# Patient Record
Sex: Female | Born: 1940 | Race: White | Hispanic: No | Marital: Married | Smoking: Never smoker
Health system: Southern US, Community
[De-identification: ages and names within clinical notes are randomized; demographics above are authoritative.]

## PROBLEM LIST (undated history)

## (undated) DIAGNOSIS — G4733 Obstructive sleep apnea (adult) (pediatric): Secondary | ICD-10-CM

## (undated) DIAGNOSIS — E669 Obesity, unspecified: Secondary | ICD-10-CM

## (undated) DIAGNOSIS — I509 Heart failure, unspecified: Secondary | ICD-10-CM

## (undated) DIAGNOSIS — M549 Dorsalgia, unspecified: Secondary | ICD-10-CM

## (undated) DIAGNOSIS — I4891 Unspecified atrial fibrillation: Secondary | ICD-10-CM

## (undated) DIAGNOSIS — I10 Essential (primary) hypertension: Secondary | ICD-10-CM

## (undated) DIAGNOSIS — E785 Hyperlipidemia, unspecified: Secondary | ICD-10-CM

## (undated) DIAGNOSIS — F32A Depression, unspecified: Secondary | ICD-10-CM

## (undated) DIAGNOSIS — K219 Gastro-esophageal reflux disease without esophagitis: Secondary | ICD-10-CM

## (undated) DIAGNOSIS — M25561 Pain in right knee: Secondary | ICD-10-CM

## (undated) DIAGNOSIS — F329 Major depressive disorder, single episode, unspecified: Secondary | ICD-10-CM

## (undated) DIAGNOSIS — M25562 Pain in left knee: Secondary | ICD-10-CM

## (undated) HISTORY — PX: CHOLECYSTECTOMY: SHX55

## (undated) HISTORY — PX: ABDOMINAL HYSTERECTOMY: SHX81

---

## 2004-03-03 ENCOUNTER — Ambulatory Visit (HOSPITAL_COMMUNITY): Admission: RE | Admit: 2004-03-03 | Discharge: 2004-03-03 | Payer: Self-pay | Admitting: Surgery

## 2004-03-09 ENCOUNTER — Ambulatory Visit (HOSPITAL_BASED_OUTPATIENT_CLINIC_OR_DEPARTMENT_OTHER): Admission: RE | Admit: 2004-03-09 | Discharge: 2004-03-09 | Payer: Self-pay | Admitting: Surgery

## 2004-03-10 ENCOUNTER — Encounter: Admission: RE | Admit: 2004-03-10 | Discharge: 2004-03-10 | Payer: Self-pay | Admitting: Surgery

## 2004-03-10 ENCOUNTER — Ambulatory Visit (HOSPITAL_COMMUNITY): Admission: RE | Admit: 2004-03-10 | Discharge: 2004-03-10 | Payer: Self-pay | Admitting: Surgery

## 2004-10-18 ENCOUNTER — Ambulatory Visit: Payer: Self-pay | Admitting: Internal Medicine

## 2006-10-02 ENCOUNTER — Encounter: Admission: RE | Admit: 2006-10-02 | Discharge: 2006-10-02 | Payer: Self-pay | Admitting: Family Medicine

## 2007-07-29 ENCOUNTER — Encounter: Admission: RE | Admit: 2007-07-29 | Discharge: 2007-07-29 | Payer: Self-pay | Admitting: Family Medicine

## 2010-06-06 ENCOUNTER — Encounter: Payer: Self-pay | Admitting: Orthopedic Surgery

## 2010-09-30 NOTE — Procedures (Signed)
Cindy Gibbs, Cindy Gibbs               ACCOUNT NO.:  [id number]   MEDICAL RECORD NO.:  [id number]          PATIENT TYPE:  OUT   LOCATION:  SLEEP CENTER                 FACILITY:  [facility]   PHYSICIAN:  Clinton D. Maple Hudson, M.D. DATE OF BIRTH:  1940/08/29   DATE OF STUDY:                              NOCTURNAL POLYSOMNOGRAM   REFERRING PHYSICIAN:  Thornton Park. Daphine Deutscher, M.D.   INDICATION FOR STUDY:  Insomnia with sleep apnea.   Epworth Sleepiness score 3/24.  Neck size 17 inches.  BMI 50.9.  Weight 299  pounds.   SLEEP ARCHITECTURE:  Total sleep time 329 minutes with sleep efficiency 74%.  Stage I was 5%, stage II 67%, stages III and IV 2%.  REM was 2.5 ________ to  25.  REM was 25% of total sleep time.  Awake after sleep onset 47 minutes.  Arousal index is 36 which is increased.  Most arousals seemed respiratory  related.   RESPIRATORY DATA:  Split study protocol.  RDI 24.7 per hour indicated  moderate obstructive sleep apnea/hypopnea syndrome before CPAP titration.  This included 14 obstructive apnea and 42 hypopnea's before CPAP.  Events  were not positional.  REM RDI was 3 per hour.  CPAP was titrated to 16 CWP,  RDI 0 per hour.  A medium Respironics comfort full face mask was used.   OXYGEN DATA:  Moderate to loud snoring with oxygen desaturation to a nadir  to 84% during apneas before CPAP.  After CPAP titration, oxygen saturation  held 93% to 96% on room air.   CARDIAC DATA:  Sinus rhythm with PAC's and intervals of what appeared to be  wandering atrial pacemaker and possibly a few junctional beats.   MOVEMENTS/PARASOMNIA:  A total of 396 limb jerks were recorded of which 18  were associated with arousal or awakening for a periodic limb movement with  arousal index of 3.4 per hour which has increased.   IMPRESSION/RECOMMENDATION:  Moderate obstructive sleep apnea/hypopnea  syndrome, RDI 24.7 per hour with desaturation to 84%.  CPAP titration to 16  CWP using a medium Respironics  comfort full face mask with heated  humidifier.  Frequent PAC's and question of occasional ectopic atrial  pacemaker and junctional beats.   Periodic limb movement with arousal, 3.4 per hour.                                                           Clinton D. Maple Hudson, M.D.  Diplomate, American Board  CDY/MEDQ  D:  03/13/2004 09:38:15  T:  03/13/2004 14:50:38  Job:  [id number]

## 2014-05-26 DIAGNOSIS — M5136 Other intervertebral disc degeneration, lumbar region: Secondary | ICD-10-CM | POA: Diagnosis not present

## 2014-05-27 DIAGNOSIS — G4733 Obstructive sleep apnea (adult) (pediatric): Secondary | ICD-10-CM | POA: Diagnosis not present

## 2014-06-11 DIAGNOSIS — G4733 Obstructive sleep apnea (adult) (pediatric): Secondary | ICD-10-CM | POA: Diagnosis not present

## 2014-06-25 DIAGNOSIS — M961 Postlaminectomy syndrome, not elsewhere classified: Secondary | ICD-10-CM | POA: Diagnosis not present

## 2014-07-01 DIAGNOSIS — Z23 Encounter for immunization: Secondary | ICD-10-CM | POA: Diagnosis not present

## 2014-07-01 DIAGNOSIS — M810 Age-related osteoporosis without current pathological fracture: Secondary | ICD-10-CM | POA: Diagnosis not present

## 2014-07-01 DIAGNOSIS — I272 Other secondary pulmonary hypertension: Secondary | ICD-10-CM | POA: Diagnosis not present

## 2014-07-01 DIAGNOSIS — E780 Pure hypercholesterolemia: Secondary | ICD-10-CM | POA: Diagnosis not present

## 2014-07-01 DIAGNOSIS — I10 Essential (primary) hypertension: Secondary | ICD-10-CM | POA: Diagnosis not present

## 2014-07-01 DIAGNOSIS — R079 Chest pain, unspecified: Secondary | ICD-10-CM | POA: Diagnosis not present

## 2014-07-12 DIAGNOSIS — G4733 Obstructive sleep apnea (adult) (pediatric): Secondary | ICD-10-CM | POA: Diagnosis not present

## 2015-03-17 ENCOUNTER — Inpatient Hospital Stay (HOSPITAL_COMMUNITY): Payer: Medicare Other

## 2015-03-17 ENCOUNTER — Encounter (HOSPITAL_COMMUNITY): Payer: Self-pay | Admitting: Internal Medicine

## 2015-03-17 ENCOUNTER — Inpatient Hospital Stay (HOSPITAL_COMMUNITY)
Admission: EM | Admit: 2015-03-17 | Discharge: 2015-03-18 | DRG: 917 | Disposition: A | Discharge status: Home Health | Payer: Medicare Other | Source: Other Acute Inpatient Hospital | Attending: Internal Medicine | Admitting: Internal Medicine

## 2015-03-17 DIAGNOSIS — Z8249 Family history of ischemic heart disease and other diseases of the circulatory system: Secondary | ICD-10-CM

## 2015-03-17 DIAGNOSIS — I110 Hypertensive heart disease with heart failure: Secondary | ICD-10-CM | POA: Diagnosis present

## 2015-03-17 DIAGNOSIS — G92 Toxic encephalopathy: Secondary | ICD-10-CM | POA: Diagnosis present

## 2015-03-17 DIAGNOSIS — R600 Localized edema: Secondary | ICD-10-CM

## 2015-03-17 DIAGNOSIS — I10 Essential (primary) hypertension: Secondary | ICD-10-CM | POA: Diagnosis not present

## 2015-03-17 DIAGNOSIS — E785 Hyperlipidemia, unspecified: Secondary | ICD-10-CM | POA: Diagnosis present

## 2015-03-17 DIAGNOSIS — I4891 Unspecified atrial fibrillation: Secondary | ICD-10-CM | POA: Diagnosis present

## 2015-03-17 DIAGNOSIS — Z7901 Long term (current) use of anticoagulants: Secondary | ICD-10-CM

## 2015-03-17 DIAGNOSIS — T402X1A Poisoning by other opioids, accidental (unintentional), initial encounter: Secondary | ICD-10-CM | POA: Diagnosis present

## 2015-03-17 DIAGNOSIS — G9340 Encephalopathy, unspecified: Secondary | ICD-10-CM | POA: Diagnosis not present

## 2015-03-17 DIAGNOSIS — M545 Low back pain: Secondary | ICD-10-CM

## 2015-03-17 DIAGNOSIS — B3749 Other urogenital candidiasis: Secondary | ICD-10-CM | POA: Diagnosis present

## 2015-03-17 DIAGNOSIS — N390 Urinary tract infection, site not specified: Secondary | ICD-10-CM | POA: Diagnosis present

## 2015-03-17 DIAGNOSIS — M549 Dorsalgia, unspecified: Secondary | ICD-10-CM | POA: Diagnosis present

## 2015-03-17 DIAGNOSIS — I481 Persistent atrial fibrillation: Secondary | ICD-10-CM | POA: Diagnosis not present

## 2015-03-17 DIAGNOSIS — F329 Major depressive disorder, single episode, unspecified: Secondary | ICD-10-CM | POA: Diagnosis present

## 2015-03-17 DIAGNOSIS — D684 Acquired coagulation factor deficiency: Secondary | ICD-10-CM | POA: Diagnosis present

## 2015-03-17 DIAGNOSIS — G4733 Obstructive sleep apnea (adult) (pediatric): Secondary | ICD-10-CM | POA: Diagnosis present

## 2015-03-17 DIAGNOSIS — I509 Heart failure, unspecified: Secondary | ICD-10-CM | POA: Diagnosis present

## 2015-03-17 DIAGNOSIS — E669 Obesity, unspecified: Secondary | ICD-10-CM | POA: Diagnosis present

## 2015-03-17 DIAGNOSIS — Z801 Family history of malignant neoplasm of trachea, bronchus and lung: Secondary | ICD-10-CM

## 2015-03-17 DIAGNOSIS — K219 Gastro-esophageal reflux disease without esophagitis: Secondary | ICD-10-CM | POA: Diagnosis present

## 2015-03-17 DIAGNOSIS — M6289 Other specified disorders of muscle: Secondary | ICD-10-CM

## 2015-03-17 DIAGNOSIS — M25562 Pain in left knee: Secondary | ICD-10-CM

## 2015-03-17 DIAGNOSIS — F32A Depression, unspecified: Secondary | ICD-10-CM | POA: Diagnosis present

## 2015-03-17 DIAGNOSIS — M25561 Pain in right knee: Secondary | ICD-10-CM | POA: Diagnosis not present

## 2015-03-17 DIAGNOSIS — R4182 Altered mental status, unspecified: Secondary | ICD-10-CM | POA: Diagnosis present

## 2015-03-17 DIAGNOSIS — M1990 Unspecified osteoarthritis, unspecified site: Secondary | ICD-10-CM | POA: Diagnosis present

## 2015-03-17 DIAGNOSIS — R531 Weakness: Secondary | ICD-10-CM | POA: Diagnosis present

## 2015-03-17 HISTORY — DX: Heart failure, unspecified: I50.9

## 2015-03-17 HISTORY — DX: Major depressive disorder, single episode, unspecified: F32.9

## 2015-03-17 HISTORY — DX: Gastro-esophageal reflux disease without esophagitis: K21.9

## 2015-03-17 HISTORY — DX: Obesity, unspecified: E66.9

## 2015-03-17 HISTORY — DX: Pain in left knee: M25.562

## 2015-03-17 HISTORY — DX: Dorsalgia, unspecified: M54.9

## 2015-03-17 HISTORY — DX: Unspecified atrial fibrillation: I48.91

## 2015-03-17 HISTORY — DX: Depression, unspecified: F32.A

## 2015-03-17 HISTORY — DX: Obstructive sleep apnea (adult) (pediatric): G47.33

## 2015-03-17 HISTORY — DX: Hyperlipidemia, unspecified: E78.5

## 2015-03-17 HISTORY — DX: Pain in right knee: M25.561

## 2015-03-17 HISTORY — DX: Essential (primary) hypertension: I10

## 2015-03-17 HISTORY — DX: Pain in left knee: M25.561

## 2015-03-17 LAB — CBC
HCT: 33.1 % — ABNORMAL LOW (ref 36.0–46.0)
HEMOGLOBIN: 10.2 g/dL — AB (ref 12.0–15.0)
MCH: 25.8 pg — AB (ref 26.0–34.0)
MCHC: 30.8 g/dL (ref 30.0–36.0)
MCV: 83.8 fL (ref 78.0–100.0)
PLATELETS: 195 10*3/uL (ref 150–400)
RBC: 3.95 MIL/uL (ref 3.87–5.11)
RDW: 16.6 % — AB (ref 11.5–15.5)
WBC: 8.8 10*3/uL (ref 4.0–10.5)

## 2015-03-17 LAB — LIPID PANEL
CHOL/HDL RATIO: 3.7 ratio
CHOLESTEROL: 149 mg/dL (ref 0–200)
HDL: 40 mg/dL — AB (ref >40)
LDL Cholesterol: 93 mg/dL (ref 0–99)
TRIGLYCERIDES: 82 mg/dL (ref <150)
VLDL: 16 mg/dL (ref 0–40)

## 2015-03-17 LAB — BASIC METABOLIC PANEL
Anion gap: 6 (ref 5–15)
BUN: 32 mg/dL — ABNORMAL HIGH (ref 6–20)
CHLORIDE: 101 mmol/L (ref 101–111)
CO2: 30 mmol/L (ref 22–32)
CREATININE: 1.1 mg/dL — AB (ref 0.44–1.00)
Calcium: 9.5 mg/dL (ref 8.9–10.3)
GFR calc non Af Amer: 49 mL/min — ABNORMAL LOW (ref >60)
GFR, EST AFRICAN AMERICAN: 57 mL/min — AB (ref >60)
Glucose, Bld: 101 mg/dL — ABNORMAL HIGH (ref 65–99)
Potassium: 4.4 mmol/L (ref 3.5–5.1)
Sodium: 137 mmol/L (ref 135–145)

## 2015-03-17 LAB — VITAMIN B12: Vitamin B-12: 721 pg/mL (ref 180–914)

## 2015-03-17 LAB — PROTIME-INR
INR: 2.8 — ABNORMAL HIGH (ref 0.00–1.49)
Prothrombin Time: 29.1 seconds — ABNORMAL HIGH (ref 11.6–15.2)

## 2015-03-17 LAB — TSH: TSH: 3.394 u[IU]/mL (ref 0.350–4.500)

## 2015-03-17 MED ORDER — HEPARIN SODIUM (PORCINE) 5000 UNIT/ML IJ SOLN: 5000.0000 [IU] | Freq: Three times a day (TID) | INTRAMUSCULAR | Status: DC

## 2015-03-17 MED ORDER — BISOPROLOL-HYDROCHLOROTHIAZIDE 5-6.25 MG PO TABS
1.0000 | ORAL_TABLET | Freq: Every day | ORAL | Status: DC
  Administered 2015-03-17: 1 via ORAL
  Filled 2015-03-17 (×2): qty 1

## 2015-03-17 MED ORDER — SERTRALINE HCL 100 MG PO TABS
100.0000 mg | ORAL_TABLET | Freq: Once | ORAL | Status: AC
  Administered 2015-03-17: 100 mg via ORAL
  Filled 2015-03-17: qty 1

## 2015-03-17 MED ORDER — WARFARIN SODIUM 3 MG PO TABS
3.5000 mg | ORAL_TABLET | ORAL | Status: AC
  Administered 2015-03-17: 3.5 mg via ORAL
  Filled 2015-03-17: qty 1

## 2015-03-17 MED ORDER — FUROSEMIDE 40 MG PO TABS
40.0000 mg | ORAL_TABLET | Freq: Every day | ORAL | Status: DC
Start: 2015-03-17 — End: 2015-03-17
  Administered 2015-03-17: 40 mg via ORAL
  Filled 2015-03-17 (×2): qty 1

## 2015-03-17 MED ORDER — PRAVASTATIN SODIUM 40 MG PO TABS
40.0000 mg | ORAL_TABLET | Freq: Every day | ORAL | Status: DC
  Administered 2015-03-17: 40 mg via ORAL
  Filled 2015-03-17: qty 1

## 2015-03-17 MED ORDER — SERTRALINE HCL 100 MG PO TABS
100.0000 mg | ORAL_TABLET | Freq: Every day | ORAL | Status: DC
  Administered 2015-03-17: 100 mg via ORAL
  Filled 2015-03-17: qty 1

## 2015-03-17 MED ORDER — ACETAMINOPHEN 325 MG PO TABS
650.0000 mg | ORAL_TABLET | Freq: Four times a day (QID) | ORAL | Status: DC | PRN
  Administered 2015-03-18: 650 mg via ORAL

## 2015-03-17 MED ORDER — NITROGLYCERIN 0.4 MG SL SUBL: 0.4000 mg | SUBLINGUAL_TABLET | SUBLINGUAL | Status: DC | PRN

## 2015-03-17 MED ORDER — SERTRALINE HCL 100 MG PO TABS
200.0000 mg | ORAL_TABLET | Freq: Every day | ORAL | Status: DC
  Administered 2015-03-18: 200 mg via ORAL
  Filled 2015-03-17: qty 2

## 2015-03-17 MED ORDER — FOSFOMYCIN TROMETHAMINE 3 G PO PACK
3.0000 g | PACK | Freq: Once | ORAL | Status: AC
  Administered 2015-03-17: 3 g via ORAL
  Filled 2015-03-17: qty 3

## 2015-03-17 MED ORDER — WARFARIN SODIUM 3 MG PO TABS
3.5000 mg | ORAL_TABLET | Freq: Once | ORAL | Status: AC
  Administered 2015-03-17: 3.5 mg via ORAL
  Filled 2015-03-17: qty 1

## 2015-03-17 MED ORDER — ACETAMINOPHEN 650 MG RE SUPP: 650.0000 mg | Freq: Four times a day (QID) | RECTAL | Status: DC | PRN

## 2015-03-17 MED ORDER — LORAZEPAM 2 MG/ML IJ SOLN
1.0000 mg | Freq: Once | INTRAMUSCULAR | Status: AC
  Administered 2015-03-17: 1 mg via INTRAVENOUS
  Filled 2015-03-17: qty 1

## 2015-03-17 MED ORDER — TRAMADOL HCL 50 MG PO TABS
50.0000 mg | ORAL_TABLET | Freq: Four times a day (QID) | ORAL | Status: DC | PRN
  Administered 2015-03-17: 50 mg via ORAL
  Filled 2015-03-17: qty 1

## 2015-03-17 MED ORDER — ONDANSETRON HCL 4 MG/2ML IJ SOLN: 4.0000 mg | Freq: Four times a day (QID) | INTRAMUSCULAR | Status: DC | PRN

## 2015-03-17 MED ORDER — PANTOPRAZOLE SODIUM 40 MG PO TBEC
40.0000 mg | DELAYED_RELEASE_TABLET | Freq: Every day | ORAL | Status: DC
  Administered 2015-03-17 – 2015-03-18 (×2): 40 mg via ORAL
  Filled 2015-03-17 (×2): qty 1

## 2015-03-17 MED ORDER — GADOBENATE DIMEGLUMINE 529 MG/ML IV SOLN
20.0000 mL | Freq: Once | INTRAVENOUS | Status: AC
  Administered 2015-03-17: 20 mL via INTRAVENOUS

## 2015-03-17 MED ORDER — WARFARIN - PHARMACIST DOSING INPATIENT: Freq: Every day | Status: DC

## 2015-03-17 MED ORDER — OXYCODONE HCL 5 MG PO TABS
10.0000 mg | ORAL_TABLET | Freq: Four times a day (QID) | ORAL | Status: DC | PRN
  Administered 2015-03-17 – 2015-03-18 (×3): 10 mg via ORAL
  Filled 2015-03-17 (×3): qty 2

## 2015-03-17 MED ORDER — LIDOCAINE 4 % EX CREA
TOPICAL_CREAM | Freq: Every day | CUTANEOUS | Status: DC | PRN
  Filled 2015-03-17: qty 5

## 2015-03-17 MED ORDER — BISOPROLOL-HYDROCHLOROTHIAZIDE 5-6.25 MG PO TABS: 1.0000 | ORAL_TABLET | Freq: Every day | ORAL | Status: DC

## 2015-03-17 MED ORDER — ONDANSETRON HCL 4 MG PO TABS: 4.0000 mg | ORAL_TABLET | Freq: Four times a day (QID) | ORAL | Status: DC | PRN

## 2015-03-17 MED ORDER — OXYCODONE HCL 5 MG PO TABS
5.0000 mg | ORAL_TABLET | Freq: Four times a day (QID) | ORAL | Status: DC | PRN
  Administered 2015-03-17: 5 mg via ORAL
  Filled 2015-03-17: qty 1

## 2015-03-17 MED ORDER — SODIUM CHLORIDE 0.9 % IJ SOLN
3.0000 mL | Freq: Two times a day (BID) | INTRAMUSCULAR | Status: DC
  Administered 2015-03-17 (×3): 3 mL via INTRAVENOUS

## 2015-03-17 MED ORDER — VITAMIN D 1000 UNITS PO TABS
1000.0000 [IU] | ORAL_TABLET | Freq: Every day | ORAL | Status: DC
  Administered 2015-03-17 – 2015-03-18 (×2): 1000 [IU] via ORAL
  Filled 2015-03-17 (×2): qty 1

## 2015-03-17 NOTE — Progress Notes (Signed)
ANTICOAGULATION CONSULT NOTE - Initial Consult  Pharmacy Consult for warfarin Indication: atrial fibrillation  Allergies not on file  Patient Measurements: Height: 5\' 4"  (162.6 cm) Weight: 261 lb 8 oz (118.616 kg) IBW/kg (Calculated) : 54.7 Heparin Dosing Weight:   Vital Signs: Temp: 98.1 F (36.7 C) (11/02 0229) Temp Source: Oral (11/02 0229) BP: 128/58 mmHg (11/02 0229) Pulse Rate: 64 (11/02 0229)  Labs: No results for input(s): HGB, HCT, PLT, APTT, LABPROT, INR, HEPARINUNFRC, CREATININE, CKTOTAL, CKMB, TROPONINI in the last 72 hours.  CrCl cannot be calculated (Patient has no serum creatinine result on file.).   Medical History: Past Medical History  Diagnosis Date  . Essential hypertension   . HLD (hyperlipidemia)   . CHF (congestive heart failure) (HCC)   . GERD (gastroesophageal reflux disease)   . Back pain   . Bilateral knee pain   . OSA (obstructive sleep apnea)   . Obesity   . Atrial fibrillation (HCC)   . Depression     Medications:  No prescriptions prior to admission    Assessment: 74 yo female with AFib, on warfarin PTA. PTA dose = 3.5 mg po daily. Pt was transferred from outside hospital. INR yesterday at outside hospital = 2.9, her last dose of warfarin was 10/31, therefore will administer dose now.   Goal of Therapy:  INR 2-3 Monitor platelets by anticoagulation protocol: Yes   Plan:  -Warfarin 3.5 mg po x1 now -Daily INR   Agapito GamesAlison Spyros Winch, PharmD, BCPS Clinical Pharmacist 03/17/2015 3:48 AM

## 2015-03-17 NOTE — Progress Notes (Signed)
[facility] Progress Note   Cindy CaffeyBarbara C Gibbs  ZOX:[id number]  DOB: 1940/11/14  DOA: 03/17/2015 PCP: Tacey RuizLLESCHEL, EMILY, FNP  Brief narrative: Cindy Gibbs is a 74 y.o. female with chronic back pain on Oxycodone, HTN, A- fib on Coumadin, CHF (unspecified) who brought in to the hospital for confusion for the past 2 days. [facility]. Her family states that her confusion is most likely secondary to Lyrica. However after further discussion, I have determined that Lyrica was started 3 months ago and the patient discontinued it a little over a week ago. Asked her confusion just started 2 days ago, I'm doubtful that it was related to the Lyrica.   Subjective: The patient has no complaints. The daughter feels that the patient is still slightly confused and states that she is seeing people who are no longer living.   Assessment/Plan: Principal Problem:   Acute encephalopathy -MRI of the brain negative-primary concern was that the patient is receiving oxycodone at home and she may have taken an excess of what is prescribed -Doubtful that a UTI could've caused the confusion as she was quite oriented by the time she was evaluated in our ER and she had not yet received treatment for the UTI -Encephalopathy now resolved-will resume oxycodone while carefully monitoring mental status  Active Problems: Dehydration-  ? Acute renal insufficiency -Elevated BUN/creatinine ratio but no old lab work to compare with-creatinine is 1.00 with a calculated GFR of 49 -Patient is on furosemide at home for a diagnosis of congestive heart failure therefore will hold off on starting fluids  Congestive heart failure-unspecified -Patient was noted to have pedal edema by admitting doctor and Lasix was increased-on exam this morning, she has no further pitting edema -We'll obtain a 2-D echo to determine the extent of  heart failure    Essential hypertension -Continue bisoprolol/HCTZ    GERD (gastroesophageal reflux disease) -Continue PPI    Back pain/   Bilateral knee pain -As mentioned above, will resume oxycodone and monitor mental status    OSA (obstructive sleep apnea) - CPAP at bedtime    Obesity    Atrial fibrillation - cont Coumadin and Bisoprolol  UTI -Admited to symptoms of dysuria - UA at [facility] was suggestive of a UTI- given Fosfomycin     Code Status:     Code Status Orders        Start     Ordered   03/17/15 0330  Full code   Continuous     03/17/15 0330     Family Communication: Husband and daughter at bedside Disposition Plan: PT recommending skilled nursing facility DVT prophylaxis: Coumadin Consultants: Procedures:  Antibiotics: Anti-infectives    None      Objective: Filed Weights   03/17/15 0229  Weight: 118.616 kg (261 lb 8 oz)    Intake/Output Summary (Last 24 hours) at 03/17/15 1431 Last data filed at 03/17/15 0857  Gross per 24 hour  Intake      3 ml  Output    150 ml  Net   -147 ml     Vitals Filed Vitals:   03/17/15 0229 03/17/15 0624  BP: 128/58 105/49  Pulse: 64 64  Temp: 98.1 F (36.7 C) 97.9 F (36.6 C)  TempSrc: Oral Oral  Resp: 18 20  Height: 5\' 4"  (1.626 m)   Weight: 118.616 kg (261 lb 8 oz)   SpO2: 96% 97%  Exam:  General:  Pt is alert, not in acute distress  HEENT: No icterus, No thrush, oral mucosa moist  Cardiovascular: regular rate and rhythm, S1/S2 No murmur  Respiratory: clear to auscultation bilaterally   Abdomen: Soft, +Bowel sounds, non tender, non distended, no guarding  MSK: No LE edema, cyanosis or clubbing  Data Reviewed: Basic Metabolic Panel:  Recent Labs Lab Mar 24, 2015 0535  NA 137  K 4.4  CL 101  CO2 30  GLUCOSE 101*  BUN 32*  CREATININE 1.10*  CALCIUM 9.5   Liver Function Tests: No results for input(s): AST, ALT, ALKPHOS, BILITOT, PROT, ALBUMIN in the last 168  hours. No results for input(s): LIPASE, AMYLASE in the last 168 hours. No results for input(s): AMMONIA in the last 168 hours. CBC:  Recent Labs Lab 2015-03-24 0535  WBC 8.8  HGB 10.2*  HCT 33.1*  MCV 83.8  PLT 195   Cardiac Enzymes: No results for input(s): CKTOTAL, CKMB, CKMBINDEX, TROPONINI in the last 168 hours. BNP (last 3 results) No results for input(s): BNP in the last 8760 hours.  ProBNP (last 3 results) No results for input(s): PROBNP in the last 8760 hours.  CBG: No results for input(s): GLUCAP in the last 168 hours.  No results found for this or any previous visit (from the past 240 hour(s)).   Studies: Mr Lodema Pilot Contrast  24-Mar-2015  CLINICAL DATA:  74 year old female transferred from [facility] with altered mental status and left side weakness. Acute encephalopathy. Initial encounter. EXAM: MRI HEAD WITHOUT AND WITH CONTRAST TECHNIQUE: Multiplanar, multiecho pulse sequences of the brain and surrounding structures were obtained without and with intravenous contrast. CONTRAST:  10 mL MultiHance COMPARISON:  [facility] Noncontrast head CT 03/16/2015. FINDINGS: Cerebral volume is within normal limits for age. No restricted diffusion to suggest acute infarction. No midline shift, mass effect, evidence of mass lesion, ventriculomegaly, extra-axial collection or acute intracranial hemorrhage. Cervicomedullary junction and pituitary are within normal limits. Major intracranial vascular flow voids are preserved. Mild for age scattered and patchy cerebral white matter T2 and FLAIR hyperintensity in a nonspecific configuration. No cortical encephalomalacia or chronic cerebral blood products. Deep gray matter nuclei, brainstem, and cerebellum within normal limits. No abnormal enhancement identified. Grossly normal visualized internal auditory structures. Mastoids are clear. Trace paranasal sinus mucosal thickening. Negative visualized orbit and scalp soft tissues. Bone  marrow signal within normal limits. Negative visualized cervical spine. IMPRESSION: No acute intracranial abnormality. Mild for age nonspecific cerebral white matter signal changes, most commonly due to chronic small vessel disease. Electronically Signed   By: Odessa Fleming M.D.   On: 03-24-15 10:17    Scheduled Meds:  Scheduled Meds: . bisoprolol-hydrochlorothiazide  1 tablet Oral Daily  . cholecalciferol  1,000 Units Oral Daily  . furosemide  40 mg Oral Daily  . pantoprazole  40 mg Oral Q1200  . pravastatin  40 mg Oral q1800  . sertraline  100 mg Oral Daily  . sodium chloride  3 mL Intravenous Q12H  . warfarin  3.5 mg Oral ONCE-1800  . Warfarin - Pharmacist Dosing Inpatient   Does not apply q1800   Continuous Infusions:   Time spent on care of this patient: 35 min   Chrsitopher Wik, MD 2015/03/24, 2:31 PM  LOS: 0 days   [facility] Office  [phone] Pager - Text Page per [email or web address] If 7PM-7AM, please contact night-coverage [email or web address]

## 2015-03-17 NOTE — H&P (Signed)
[facility] History and Physical  KIRAT MEZQUITA ZOX:[id number] DOB: 04-06-1941 DOA: 03/17/2015  Referring physician: ED physician PCP: Tacey Ruiz, FNP  Specialists:   Chief Complaint: Altered mental status, dysuria, burning on urination.   HPI: Cindy Gibbs is a 74 y.o. female with PMH of hypertension, osteoarthritis, hyperlipidemia, congestive heart failure, chronic back pain, chronic bilateral knee pain, obesity, atrial fibrillation on Coumadin, who presents with altered mental status and dysuria and burning on urination  Patient transferred from Hospital per Dr. Denyce Robert, mainly for getting MRI of brain tonight.  Per EDP, patient's daughter noticed that patient has been confused in the past two days. She has been talking to her deceased husband. She has been moving her CPAP in the night. Of note, patient is taking high dose of narcotics for her chronic back pain. She is getting 180 tablets of oxycodone per month. She is taking oxycodone 20 mg 3 times a day. She is also on tramadol. She was recently started with Lyrica one month ago for chronic back pain. Patient reports that she has R- sided weakness in the past 3 weeks. She does not have chest pain, abdominal pain, diarrhea. She has dysuria and burning on urination. She is on Lasix, therefore can not tell whether she has increased urinary frequency. When I saw pt on the floor, she is oriented x 3 and answered all questions appropriately.   In Ed, she was found to have positive urinalysis with small amount of leukocytes, lactate 0.6, and INR 2.91, PTT 48.6, BNP 4240, WBC 9.2, hemoglobin 10.6, temperature normal, no tachycardia, oxygen saturation normal, electrolytes okay. CT head showed small area of edema in subcortical white matter over left parietal lobe, no intracranial hemorrhage. Neurology was consulted by EDP, Dr. Hosie Poisson saw pt.   Where does patient live?   At home    Can patient participate in ADLs? Some   Review of  Systems:   General: no fevers, chills, no changes in body weight, has fatigue HEENT: no blurry vision, hearing changes or sore throat Pulm: no dyspnea, coughing, wheezing CV: no chest pain, palpitations Abd: no nausea, vomiting, abdominal pain, diarrhea, constipation GU: has dysuria, burning on urination, no hematuria  Ext: has leg edema Neuro: has R sided weakness, no vision change or hearing loss Skin: no rash MSK: No muscle spasm, no deformity, no limitation of range of movement in spin. Has back and knee pain. Heme: No easy bruising.  Travel history: No recent long distant travel.  Allergy: Allergies not on file  Past Medical History  Diagnosis Date  . Essential hypertension   . HLD (hyperlipidemia)   . CHF (congestive heart failure) (HCC)   . GERD (gastroesophageal reflux disease)   . Back pain   . Bilateral knee pain   . OSA (obstructive sleep apnea)   . Obesity   . Atrial fibrillation (HCC)   . Depression     Past Surgical History  Procedure Laterality Date  . Cholecystectomy    . Abdominal hysterectomy      Social History:  has no tobacco, alcohol, and drug history on file.  Family History:  Family History  Problem Relation Age of Onset  . Lung cancer Mother   . Heart disease Father   . Cirrhosis Brother      Prior to Admission medications   Not on File    Physical Exam: Filed Vitals:   03/17/15 0229  BP: 128/58  Pulse: 64  Temp: 98.1 F (36.7 C)  TempSrc:  Oral  Resp: 18  Height: 5\' 4"  (1.626 m)  Weight: 118.616 kg (261 lb 8 oz)  SpO2: 96%   General: Not in acute distress HEENT:       Eyes: PERRL, EOMI, no scleral icterus.       ENT: No discharge from the ears and nose, no pharynx injection, no tonsillar enlargement.        Neck: Difficult to assess JVD due to obesity, no bruit, no mass felt. Heme: No neck lymph node enlargement. Cardiac: S1/S2, irregularly irregular rhythm, No murmurs, No gallops or rubs. Pulm: No rales, wheezing,  rhonchi or rubs. Abd: Soft, nondistended, nontender, no rebound pain, no organomegaly, BS present. Ext: 1+ pitting leg edema in the left leg and trace leg edema in the R. 2+DP/PT pulse bilaterally. Musculoskeletal: No joint deformities, No joint redness or warmth, no limitation of ROM in spin. Skin: No rashes.  Neuro: Alert, oriented X3, cranial nerves II-XII grossly intact, muscle strength 5/5 in all extremities, sensation to light touch intact. Brachial reflex 1+ bilaterally. Knee reflex 1+ bilaterally. Negative Babinski's sign. Normal finger to nose test. Psych: Patient is not psychotic, no suicidal or hemocidal ideation.  Labs on Admission:  Basic Metabolic Panel: No results for input(s): NA, K, CL, CO2, GLUCOSE, BUN, CREATININE, CALCIUM, MG, PHOS in the last 168 hours. Liver Function Tests: No results for input(s): AST, ALT, ALKPHOS, BILITOT, PROT, ALBUMIN in the last 168 hours. No results for input(s): LIPASE, AMYLASE in the last 168 hours. No results for input(s): AMMONIA in the last 168 hours. CBC: No results for input(s): WBC, NEUTROABS, HGB, HCT, MCV, PLT in the last 168 hours. Cardiac Enzymes: No results for input(s): CKTOTAL, CKMB, CKMBINDEX, TROPONINI in the last 168 hours.  BNP (last 3 results) No results for input(s): BNP in the last 8760 hours.  ProBNP (last 3 results) No results for input(s): PROBNP in the last 8760 hours.  CBG: No results for input(s): GLUCAP in the last 168 hours.  Radiological Exams on Admission: No results found.  EKG: Independently reviewed.  Abnormal findings:  Atrial fibrillation with slow heart rate  Assessment/Plan Principal Problem:   Acute encephalopathy Active Problems:   Essential hypertension   GERD (gastroesophageal reflux disease)   Back pain   Bilateral knee pain   OSA (obstructive sleep apnea)   Obesity   Atrial fibrillation (HCC)   Depression   uti   Right sided weakness   HLD (hyperlipidemia)   Acute  encephalopathy: Etiology is not clear. The patient is oriented 3. Differential diagnoses include possible UTI, and TIA/stroke, side effect of pain medications. Neurology was consulted, Dr. Hosie PoissonSumner evaluated the patient.  -Will admit to tele bed -Appreciate Dr. Minus BreedingSumner's consultation, with follow-up recommendations. -check Vb12 and TSH -Frequent neurochecks -hold lyrica and oxycodone -MRI of brain with and w/o CM.  Essential hypertension: -Ziac, Lasix  CHF exacerbation: No 2-D echo on record, not sure which type of congestive heart failure. Patient has bilateral leg edema with L>R. BNP 4240, indicating said to have exacerbation. -will increase lasix dose from 20 to 40 mg daily -continue Ziac  GERD: -Protonix  Chronic back pain and knee pain: -Hold lyrica and oxycodone as above -prn tramadol  OSA: -CPAP  Atrial Fibrillation: CHA2DS2-VASc Score is 4, needs oral anticoagulation. Patient is on Coumadin at home. INR is 2.91 on admission. Heart rate is well controlled. -continue coumadin per pharmacy -on Ziac  Depression: Stable, no suicidal or homicidal ideations. -Continue home medications: Zoloft  Possible UTI: Patient  has symptoms of dysuria and burning on urination. Urinalysis showed small amount of leukocytes. -give fosfomycin 1 -F/u Ux  Right sided weakness: Patient reports right-sided weakness, but physical examination did not show right-sided weakness or any other focal neurologic findings. -Follow-up MRI of the brain with and without contrast  HLD: Last LDL was not our record -pravastatin -Check FLP    DVT ppx: SCD Code Status: Full code Family Communication: None at bed side.   Disposition Plan: Admit to inpatient   Date of Service 03/17/2015    Lorretta Harp [facility] Pager [phone]  If 7PM-7AM, please contact night-coverage [email or web address] Password [facility] 03/17/2015, 3:58 AM

## 2015-03-17 NOTE — Progress Notes (Signed)
Followed up on patient and she states she is feeling less "foggy".  Still appears to have some confusion about events leading up to admission. MRI brain has been done awaiting reading.  UA, B12, TSH, INR all WNL.   Will continue to follow  Felicie MornDavid Talayia Hjort PA-C [facility] [phone]  03/17/2015, 10:24 AM

## 2015-03-17 NOTE — Consult Note (Signed)
Consult Reason for Consult: altered mental status Referring Physician: Dr Clyde LundborgNiu [facility]  CC: altered mental status  HPI: Cindy CaffeyBarbara C Gibbs is an 74 y.o. female hx of chronic pain, HTN, HLD, CHF, A fib on coumadin [facility] ED with AMS x 2 days along with left-sided weakness x 3 weeks. Described as a cloudy, slow thinking. Feels mental status has improved. Of note, the patient takes high doses of narcotics for back pain, ED physician questions if she is taking more than instructed. Currently getting 180 tablets of 20mg  oxycodone per month. She reports taking 3 tablets per day for around 2 years. Also takes lyrica daily, though unsure on dose. In ED patient noted to be at her baseline (per family) with no weakness noted on exam. CT head completed in ED shows edema in subcortical white matter over left parietal lobe. [facility].   [facility] noted to be at baseline mental status. Reports sensation of weakness on her right side. At home uses a walker at baseline.   Past medical history: HTN, HLD, CHF Denies past surgical history Denies EtOH, tobacco usage    Allergies not on file  Medications:  Scheduled: . bisoprolol-hydrochlorothiazide  1 tablet Oral Daily  . cholecalciferol  1,000 Units Oral Daily  . fosfomycin  3 g Oral Once  . furosemide  40 mg Oral Daily  . pantoprazole  40 mg Oral Q1200  . pravastatin  40 mg Oral q1800  . sertraline  100 mg Oral Daily    ROS: Out of a complete 14 system review, the patient complains of only the following symptoms, and all other reviewed systems are negative. +confusion, weakness  Physical Examination: Filed Vitals:   03/17/15 0229  BP: 128/58  Pulse: 64  Temp: 98.1 F (36.7 C)  Resp: 18   Physical Exam  Constitutional: He appears well-developed and well-nourished.  Psych: Affect appropriate to situation Eyes: No scleral injection HENT: No OP obstrucion Head: Normocephalic.   Cardiovascular: Normal rate and regular rhythm.  Respiratory: Effort normal and breath sounds normal.  GI: Soft. Bowel sounds are normal. No distension. There is no tenderness.  Skin: WDI  Neurologic Examination Mental Status: Alert, oriented, thought content appropriate.  Speech fluent without evidence of aphasia.  Able to follow 3 step commands without difficulty. Cranial Nerves: II: funduscopic exam wnl bilaterally, visual fields grossly normal, pupils equal, round, reactive to light and accommodation III,IV, VI: ptosis not present, extra-ocular motions intact bilaterally V,VII: smile symmetric, facial light touch sensation normal bilaterally VIII: hearing normal bilaterally IX,X: gag reflex present XI: trapezius strength/neck flexion strength normal bilaterally XII: tongue strength normal  Motor: Right : Upper extremity    Left:     Upper extremity 5/5 deltoid       5/5 deltoid 5/5 biceps      5/5 biceps  5/5 triceps      5/5 triceps 5/5 hand grip      5/5 hand grip  Lower extremity     Lower extremity 4-/5 hip flexor      4-/5 hip flexor 4-/5 quadricep      4-/5 quadriceps  4/5 hamstrings     4/5 hamstrings 5-/5 plantar flexion       5-/5 plantar flexion 5-/5 plantar extension     5-/5 plantar extension Tone and bulk:normal tone throughout; no atrophy noted Sensory: Pinprick and light touch intact throughout, bilaterally Deep Tendon Reflexes: 1+ and symmetric throughout, absent AJs bilaterally  Plantars: Right: downgoing   Left: downgoing Cerebellar: normal finger-to-nose, difficulty completing HTS Gait: deferred  Laboratory Studies:   Basic Metabolic Panel: No results for input(s): NA, K, CL, CO2, GLUCOSE, BUN, CREATININE, CALCIUM, MG, PHOS in the last 168 hours.  Liver Function Tests: No results for input(s): AST, ALT, ALKPHOS, BILITOT, PROT, ALBUMIN in the last 168 hours. No results for input(s): LIPASE, AMYLASE in the last 168 hours. No results for input(s):  AMMONIA in the last 168 hours.  CBC: No results for input(s): WBC, NEUTROABS, HGB, HCT, MCV, PLT in the last 168 hours.  Cardiac Enzymes: No results for input(s): CKTOTAL, CKMB, CKMBINDEX, TROPONINI in the last 168 hours.  BNP: Invalid input(s): POCBNP  CBG: No results for input(s): GLUCAP in the last 168 hours.  Microbiology: No results found for this or any previous visit.  Coagulation Studies: No results for input(s): LABPROT, INR in the last 72 hours.  Urinalysis: No results for input(s): COLORURINE, LABSPEC, PHURINE, GLUCOSEU, HGBUR, BILIRUBINUR, KETONESUR, PROTEINUR, UROBILINOGEN, NITRITE, LEUKOCYTESUR in the last 168 hours.  Invalid input(s): APPERANCEUR  Lipid Panel:  No results found for: CHOL, TRIG, HDL, CHOLHDL, VLDL, LDLCALC  HgbA1C: No results found for: HGBA1C  Urine Drug Screen:  No results found for: LABOPIA, COCAINSCRNUR, LABBENZ, AMPHETMU, THCU, LABBARB  Alcohol Level: No results for input(s): ETH in the last 168 hours.  Other results:  Imaging: No results found.   Assessment/Plan:  74y/o woman with hx of chronic pain, HTN, HLD, A fib on coumadin transferred from [facility] with AMS and CT head showing abnormality in left parietal region. Patient currently at baseline mental status. Question if polypharmacy may have played a role in her confusion.   -check MRI brain with and without contrast -UA, B12, TSH, INR -limit narcotic usage   Elspeth Cho, DO [facility] [phone]  If 7pm- 7am, please page neurology on call as listed in AMION. 03/17/2015, 2:48 AM

## 2015-03-17 NOTE — Care Management Note (Addendum)
Case Management Note  Patient Details  Name: Cindy CaffeyBarbara C Gibbs MRN: [id number] Date of Birth: [birth year]  Subjective/Objective:  74 y.o. F admitted with acute encephalopathy. Originally thought to be related to Narcotic use as she does suffer from CBP/Obesity/CHF/HTN/OA.   However this was ruled out. PT is recommending SNF.                   Action/Plan: Will refer to CSW and follow for any additional CM needs.    Expected Discharge Date:                  Expected Discharge Plan:  Skilled Nursing Facility  In-House Referral:  Clinical Social Work  Discharge planning Services  CM Consult  Post Acute Care Choice:    Choice offered to:     DME Arranged:    DME Agency:     HH Arranged:    HH Agency:     Status of Service:  In process, will continue to follow  Medicare Important Message Given:    Date Medicare IM Given:    Medicare IM give by:    Date Additional Medicare IM Given:    Additional Medicare Important Message give by:     If discussed at Long Length of Stay Meetings, dates discussed:    Additional Comments:  Yvone NeuCrutchfield, Maximillion Gill M, RN 03/17/2015, 3:59 PM

## 2015-03-17 NOTE — Progress Notes (Signed)
RT note: Placed patient on CPAP 15 cmH2O with full face mask per home settings. Patient tolerating at this time.

## 2015-03-17 NOTE — Progress Notes (Signed)
Pt transferred to unit via EMS x 2. Pt alert and oriented upon arrival. No complaint of pain or discomfort. No sign or symptoms of acute distress. Pt oriented to unit, as well as unit procedures. Pt vital signs within normal limits. Pt now resting in bed at lowest position, bed alarm on, call light in reach. Will continue to monitor. Delfino Lovettichie Zephaniah Enyeart, RN, BSN 03/17/2015 2:52 AM

## 2015-03-17 NOTE — Evaluation (Signed)
Physical Therapy Evaluation Patient Details Name: Cindy Gibbs MRN: [id number] DOB: May 04, 1941 Today's Date: 03/17/2015   History of Present Illness  Pt is a 74 y.o. female with PMH of HTN, OA, CHF, chronic back pain, chronic bilateral knee pain, obesity, and a-fib on Coumadin, who presents with altered mental status, dysuria and burning on urination. Imaging revealed no acute abnormalities.  Clinical Impression  Pt admitted with above diagnosis. Pt currently with functional limitations due to the deficits listed below (see PT Problem List). At the time of PT eval pt was able to perform transfers with +2 mod-max assist, and was unable to ambulate. Pt reports that she currently is alone for most of her day, and at this time pt is not safe to return home without significant assistance. Pt and family were agreeable to SNF search to maximize functional independence prior to return home. Pt will benefit from skilled PT to increase their independence and safety with mobility to allow discharge to the venue listed below.       Follow Up Recommendations SNF;Supervision/Assistance - 24 hour    Equipment Recommendations  None recommended by PT    Recommendations for Other Services       Precautions / Restrictions Precautions Precautions: Fall Restrictions Weight Bearing Restrictions: No      Mobility  Bed Mobility Overal bed mobility: Needs Assistance Bed Mobility: Supine to Sit     Supine to sit: Min guard     General bed mobility comments: Assist to elevate trunk to full sitting position at EOB. Increased time to scoot all the way out so feet were resting on floor.   Transfers Overall transfer level: Needs assistance Equipment used: Rolling walker (2 wheeled);2 person hand held assist Transfers: Sit to/from [employer] Sit to Stand: Max assist;+2 physical assistance Stand pivot transfers: Mod assist;+2 physical assistance       General transfer comment:  Attempted sit<>stand multiple times with RW at EOB. Pt was unable to complete, even with max effort from therapist. RN and therapist assisted pt into standing and she was able to take a few pivotal steps around to the [facility]. When done on [facility] pt stood and BSC was replaced with the recliner chair.   Ambulation/Gait             General Gait Details: Unable at this time.   Stairs            Wheelchair Mobility    Modified Rankin (Stroke Patients Only)       Balance Overall balance assessment: Needs assistance Sitting-balance support: Feet supported;No upper extremity supported Sitting balance-Leahy Scale: Fair     Standing balance support: Bilateral upper extremity supported;During functional activity Standing balance-Leahy Scale: Poor                               Pertinent Vitals/Pain Pain Assessment: 0-10 Pain Score: 7  Pain Location: Back, knees Pain Descriptors / Indicators: Discomfort Pain Intervention(s): RN gave pain meds during session    Home Living Family/patient expects to be discharged to:: Private residence Living Arrangements: Alone Available Help at Discharge: Family;Available PRN/intermittently Type of Home: House         Home Equipment: Walker - 4 wheels;Cane - single point      Prior Function Level of Independence: Needs assistance   Gait / Transfers Assistance Needed: Uses the rollator for ambulation. Usually only household distances  ADL's / Homemaking Assistance Needed:  Pt sponge bathes and states she usually requires assist to complete all ADL's. When she is alone she does what she can complete safely.         Hand Dominance   Dominant Hand: Right    Extremity/Trunk Assessment   Upper Extremity Assessment: Defer to OT evaluation           Lower Extremity Assessment: Generalized weakness      Cervical / Trunk Assessment: Kyphotic  Communication   Communication: No difficulties  Cognition  Arousal/Alertness: Awake/alert Behavior During Therapy: WFL for tasks assessed/performed Overall Cognitive Status: Within Functional Limits for tasks assessed                      General Comments      Exercises        Assessment/Plan    PT Assessment Patient needs continued PT services  PT Diagnosis Difficulty walking;Generalized weakness   PT Problem List Decreased strength;Decreased range of motion;Decreased activity tolerance;Decreased balance;Decreased mobility;Decreased knowledge of use of DME;Decreased safety awareness;Decreased knowledge of precautions;Pain  PT Treatment Interventions DME instruction;Gait training;Stair training;Functional mobility training;Therapeutic activities;Therapeutic exercise;Neuromuscular re-education;Patient/family education   PT Goals (Current goals can be found in the Care Plan section) Acute Rehab PT Goals PT Goal Formulation: With patient/family Time For Goal Achievement: 03/31/15 Potential to Achieve Goals: Fair    Frequency Min 2X/week   Barriers to discharge Decreased caregiver support Pt is home alone for most of her day    Co-evaluation               End of Session Equipment Utilized During Treatment: Gait belt Activity Tolerance: Patient limited by fatigue Patient left: in chair;with call bell/phone within reach;with chair alarm set;with nursing/sitter in room;with family/visitor present Nurse Communication: Mobility status         Time: 1130-1158 PT Time Calculation (min) (ACUTE ONLY): 28 min   Charges:   PT Evaluation $Initial PT Evaluation Tier I: 1 Procedure PT Treatments $Therapeutic Activity: 8-22 mins   PT G Codes:        Conni SlipperKirkman, Aileana Hodder 03/17/2015, 12:18 PM   Conni SlipperLaura Ralphine Hinks, PT, DPT Acute Rehabilitation Services Pager: [phone]

## 2015-03-18 ENCOUNTER — Inpatient Hospital Stay (HOSPITAL_COMMUNITY): Payer: Medicare Other

## 2015-03-18 DIAGNOSIS — R600 Localized edema: Secondary | ICD-10-CM

## 2015-03-18 DIAGNOSIS — K219 Gastro-esophageal reflux disease without esophagitis: Secondary | ICD-10-CM

## 2015-03-18 DIAGNOSIS — E785 Hyperlipidemia, unspecified: Secondary | ICD-10-CM

## 2015-03-18 DIAGNOSIS — G4733 Obstructive sleep apnea (adult) (pediatric): Secondary | ICD-10-CM

## 2015-03-18 DIAGNOSIS — N390 Urinary tract infection, site not specified: Secondary | ICD-10-CM

## 2015-03-18 DIAGNOSIS — F329 Major depressive disorder, single episode, unspecified: Secondary | ICD-10-CM

## 2015-03-18 LAB — BASIC METABOLIC PANEL
Anion gap: 10 (ref 5–15)
BUN: 25 mg/dL — AB (ref 6–20)
CHLORIDE: 101 mmol/L (ref 101–111)
CO2: 30 mmol/L (ref 22–32)
CREATININE: 0.89 mg/dL (ref 0.44–1.00)
Calcium: 9.5 mg/dL (ref 8.9–10.3)
GFR calc Af Amer: >60 mL/min (ref >60)
GFR calc non Af Amer: >60 mL/min (ref >60)
Glucose, Bld: 100 mg/dL — ABNORMAL HIGH (ref 65–99)
Potassium: 4.2 mmol/L (ref 3.5–5.1)
SODIUM: 141 mmol/L (ref 135–145)

## 2015-03-18 LAB — URINE CULTURE

## 2015-03-18 LAB — PROTIME-INR
INR: 3.12 — ABNORMAL HIGH (ref 0.00–1.49)
PROTHROMBIN TIME: 31.5 s — AB (ref 11.6–15.2)

## 2015-03-18 MED ORDER — WARFARIN 0.5 MG HALF TABLET
0.5000 mg | ORAL_TABLET | Freq: Once | ORAL | Status: DC
  Filled 2015-03-18: qty 1

## 2015-03-18 NOTE — Progress Notes (Signed)
Met with patient and niece at bedside to discuss discharge planning. Patient and niece are currently declining SNF.  Patient is currently using [facility] for [facility] services, which she would like to continue.  Dr Wynelle Cleveland was paged requesting home health orders.  CM will contact [facility] once orders have been placed.

## 2015-03-18 NOTE — Progress Notes (Signed)
Occupational Therapy Evaluation Patient Details Name: Lynnette CaffeyBarbara C Koch MRN: [id number] DOB: [birth year] Today's Date: 03/18/2015    History of Present Illness Pt is a 74 y.o. female with PMH of HTN, OA, CHF, chronic back pain, chronic bilateral knee pain, obesity, and a-fib on Coumadin, who presents with altered mental status, dysuria and burning on urination. Imaging revealed no acute abnormalities.   Clinical Impression   Pt admitted with the above diagnoses and presents with below problem list. Pt will benefit from continued acute OT to address the below listed deficits and maximize independence with BADLs prior to d/c to venue below. Extensively discussed home setup, DME, AE, and utilizing available help at home with pt. Pt completed short distance functional mobility to the foot of the bed with +2 min physical assist. Pt noted to have declined SNF, would benefit from maximum home services. OT to continue to follow acutely.      Follow Up Recommendations  Supervision/Assistance - 24 hour;Home health OT;Other (comment) (Pt declined SNF.)    Equipment Recommendations  Wheelchair (measurements OT); Home health aide    Recommendations for Other Services       Precautions / Restrictions Precautions Precautions: Fall Restrictions Weight Bearing Restrictions: No      Mobility Bed Mobility Overal bed mobility: Needs Assistance Bed Mobility: Supine to Sit     Supine to sit: Min assist;HOB elevated     General bed mobility comments: min A to powerup trunk; pt has hospital bed. Assist to scoot forward to EOB.  Transfers Overall transfer level: Needs assistance Equipment used: Rolling walker (2 wheeled) Transfers: Sit to/from Stand Sit to Stand: +2 physical assistance;Mod assist         General transfer comment: from EOB and 3n1; Mod A +[facility] from [facility] after walking in room likely due to fatigue    Balance Overall balance assessment: Needs assistance Sitting-balance support:  No upper extremity supported;Feet supported Sitting balance-Leahy Scale: Fair     Standing balance support: Bilateral upper extremity supported;During functional activity Standing balance-Leahy Scale: Poor                              ADL Overall ADL's : Needs assistance/impaired Eating/Feeding: Set up;Sitting   Grooming: Set up;Sitting   Upper Body Bathing: Minimal assitance;Sitting   Lower Body Bathing: Maximal assistance;Sit to/from stand;+2 for safety/equipment;+2 for physical assistance   Upper Body Dressing : Minimal assistance;Sitting   Lower Body Dressing: Maximal assistance;+2 for physical assistance;+2 for safety/equipment;Sit to/from stand   Toilet Transfer: Stand-pivot;+2 for physical assistance;Minimal assistance;Ambulation;RW;BSC   Toileting- Clothing Manipulation and Hygiene: Maximal assistance;+2 for physical assistance;Sit to/from Nurse, children'sstand     Tub/Shower Transfer Details (indicate cue type and reason): sponge bathes at baseline Functional mobility during ADLs: +2 for physical assistance;Minimal assistance;Rolling walker General ADL Comments: Pt completed bed mobility and in-room functional mobility about 10 feet prior to needing rest break. Pt completed toilet transfer to [facility] in sit<>stnad position. Mod A +2 for balance for pericare with pericare completed by family member. Discussed plan for returning home as pt has refused SNF. Pt does not have assistance during the hours of 3-11 while spouse is at work. Pt reports she cannot afford home health aide. Discussed home setup, equipment and therapy recommendations. Niece present for session.      Vision     Perception     Praxis      Pertinent Vitals/Pain Pain Assessment: 0-10 Pain Score: 6  Pain Location: b knees Pain Descriptors / Indicators: Aching;Sore;Spasm Pain Intervention(s): Limited activity within patient's tolerance;Monitored during session;Repositioned     Hand Dominance Right    Extremity/Trunk Assessment Upper Extremity Assessment Upper Extremity Assessment: Generalized weakness   Lower Extremity Assessment Lower Extremity Assessment: Defer to PT evaluation;Generalized weakness   Cervical / Trunk Assessment Cervical / Trunk Assessment: Kyphotic   Communication Communication Communication: No difficulties   Cognition Arousal/Alertness: Awake/alert Behavior During Therapy: WFL for tasks assessed/performed Overall Cognitive Status: Within Functional Limits for tasks assessed                     General Comments       Exercises       Shoulder Instructions      Home Living Family/patient expects to be discharged to:: Private residence Living Arrangements: Alone Available Help at Discharge: Family;Available PRN/intermittently Type of Home: House Home Access: Stairs to enter [employer] of Steps: 2 Entrance Stairs-Rails: Left Home Layout: One level     Bathroom Shower/Tub: Tub/shower unit Shower/tub characteristics: Curtain Firefighter: Handicapped height Bathroom Accessibility: Yes How Accessible: Accessible via walker Home Equipment: Walker - 4 wheels;Cane - single point;Bedside commode;Tub bench;Adaptive equipment   Additional Comments: Pt's husband works second shift. Daughter is on dialysis 3 days/week. Pt currently has no assist from about 3-11pm.       Prior Functioning/Environment Level of Independence: Needs assistance  Gait / Transfers Assistance Needed: Uses the rollator for ambulation. Usually only household distances ADL's / Homemaking Assistance Needed: Pt sponge bathes and states she usually requires assist to complete all ADL's. When she is alone she does what she can complete safely.         OT Diagnosis: Generalized weakness;Acute pain   OT Problem List: Decreased strength;Decreased activity tolerance;Impaired balance (sitting and/or standing);Decreased knowledge of use of DME or AE;Decreased  knowledge of precautions;Obesity;Pain   OT Treatment/Interventions: Self-care/ADL training;Therapeutic exercise;DME and/or AE instruction;Energy conservation;Therapeutic activities;Patient/family education;Balance training    OT Goals(Current goals can be found in the care plan section) Acute Rehab OT Goals Patient Stated Goal: not stated OT Goal Formulation: With patient/family Time For Goal Achievement: 03/25/15 Potential to Achieve Goals: Good ADL Goals Pt Will Perform Lower Body Bathing: with min guard assist;with adaptive equipment;sit to/from stand Pt Will Perform Lower Body Dressing: with min guard assist;with adaptive equipment;sit to/from stand Pt Will Transfer to Toilet: with min guard assist;stand pivot transfer;bedside commode Pt Will Perform Tub/Shower Transfer: with min guard assist;Stand pivot transfer;rolling walker  OT Frequency: Min 2X/week   Barriers to D/C: Decreased caregiver support  does not have 24 hour assist at home       Co-evaluation              End of Session Equipment Utilized During Treatment: Gait belt;Rolling walker  Activity Tolerance: Patient tolerated treatment well Patient left: in chair;with call bell/phone within reach;with chair alarm set;with family/visitor present   Time: 1610-9604 OT Time Calculation (min): 33 min Charges:  OT General Charges $OT Visit: 1 Procedure OT Evaluation $Initial OT Evaluation Tier I: 1 Procedure OT Treatments $Self Care/Home Management : 8-22 mins G-Codes:    Pilar Grammes 04/10/2015, 2:12 PM

## 2015-03-18 NOTE — Progress Notes (Signed)
ANTICOAGULATION CONSULT NOTE Pharmacy Consult for warfarin Indication: atrial fibrillation  No Known Allergies  Labs:  Recent Labs  03/17/15 0535 03/17/15 0815 03/18/15 0245 03/18/15 0901  HGB 10.2*  --   --   --   HCT 33.1*  --   --   --   PLT 195  --   --   --   LABPROT  --  29.1* 31.5*  --   INR  --  2.80* 3.12*  --   CREATININE 1.10*  --   --  0.89    Estimated Creatinine Clearance: 72.4 mL/min (by C-G formula based on Cr of 0.89).   Assessment: 74 yo female with AFib, on warfarin PTA. PTA dose = 3.5 mg po daily. Pt was transferred from outside hospital.  INR has trended up today to 3.12  Goal of Therapy:  INR 2-3 Monitor platelets by anticoagulation protocol: Yes   Plan:  -Warfarin 0.5 mg po x1 tonight -Daily INR  Thank you Okey RegalLisa Hazleigh Mccleave, PharmD [phone]  03/18/2015 11:27 AM

## 2015-03-18 NOTE — Progress Notes (Signed)
Physical Therapy Treatment Patient Details Name: AURIAH HOLLINGS MRN: [id number] DOB: Nov 01, 1940 Today's Date: 03/18/2015    History of Present Illness Pt is a 74 y.o. female with PMH of HTN, OA, CHF, chronic back pain, chronic bilateral knee pain, obesity, and a-fib on Coumadin, who presents with altered mental status, dysuria and burning on urination. Imaging revealed no acute abnormalities.    PT Comments    Pt progressing slowly towards physical therapy goals. Discussed safety with discharge and continued recommendation for SNF instead of return home. Pt's family member was present during session and states that pt is too weak to go to rehab and will go home first to get stronger before they talk about doing any more physical therapy. Feel there may have been a miscommunication about "rehab" and clarified differences between CIR and SNF. Strongly encouraged pt and family to reconsider SNF at d/c as I do not feel she is safe to be alone. Family member appeared mildly aggressive about talk of pt going anywhere but home, and therapist reinforced concern for safety.   Pt states she will have her husband put up a ramp before she gets home this afternoon, and will manage as well as possible. If pt does not have access to ramp this afternoon, may want to consider an ambulance transfer. Pt has not been able to tolerate stair training, and it is likely that she will not be able to enter home without a wheelchair and ramp.   Follow Up Recommendations  SNF;Supervision/Assistance - 24 hour     Equipment Recommendations  Wheelchair (measurements PT);Wheelchair cushion (measurements PT)    Recommendations for Other Services       Precautions / Restrictions Precautions Precautions: Fall Restrictions Weight Bearing Restrictions: No    Mobility  Bed Mobility Overal bed mobility: Needs Assistance Bed Mobility: Supine to Sit     Supine to sit: Min assist;HOB elevated     General bed mobility  comments: Pt received sitting up in the recliner chair.   Transfers Overall transfer level: Needs assistance Equipment used: None Transfers: Squat Pivot Transfers Sit to Stand: +2 physical assistance;Mod assist   Squat pivot transfers: Min assist;+2 physical assistance     General transfer comment: Pt transferred from chair to [facility]. Attempted to let pt transfer herself to the [facility] to simulate home environment. Was not able to achieve without +2 min assist to clear hips and pivot around.   Ambulation/Gait             General Gait Details: Unable at this time. Pt declines practicing ambulation due to fatigue   Stairs            Wheelchair Mobility    Modified Rankin (Stroke Patients Only)       Balance Overall balance assessment: Needs assistance Sitting-balance support: Feet supported;No upper extremity supported Sitting balance-Leahy Scale: Fair     Standing balance support: Bilateral upper extremity supported;During functional activity Standing balance-Leahy Scale: Poor                      Cognition Arousal/Alertness: Awake/alert Behavior During Therapy: WFL for tasks assessed/performed Overall Cognitive Status: Within Functional Limits for tasks assessed                      Exercises      General Comments        Pertinent Vitals/Pain Pain Assessment: Faces Pain Score: 6  Faces Pain Scale: Hurts little more Pain  Location: knees Pain Descriptors / Indicators: Grimacing Pain Intervention(s): Monitored during session;Repositioned    Home Living Family/patient expects to be discharged to:: Private residence Living Arrangements: Alone Available Help at Discharge: Family;Available PRN/intermittently Type of Home: House Home Access: Stairs to enter Entrance Stairs-Rails: Left Home Layout: One level Home Equipment: Walker - 4 wheels;Cane - single point;Bedside commode;Tub bench;Adaptive equipment Additional Comments: Pt's husband  works second shift. Daughter is on dialysis 3 days/week. Pt currently has no assist from about 3-11pm.     Prior Function Level of Independence: Needs assistance  Gait / Transfers Assistance Needed: Uses the rollator for ambulation. Usually only household distances ADL's / Homemaking Assistance Needed: Pt sponge bathes and states she usually requires assist to complete all ADL's. When she is alone she does what she can complete safely.      PT Goals (current goals can now be found in the care plan section) Acute Rehab PT Goals Patient Stated Goal: not stated PT Goal Formulation: With patient/family Time For Goal Achievement: 03/31/15 Potential to Achieve Goals: Fair Progress towards PT goals: Progressing toward goals    Frequency  Min 2X/week    PT Plan Current plan remains appropriate    Co-evaluation             End of Session   Activity Tolerance: Patient limited by fatigue Patient left: Other (comment) (On BSC with family member present)     Time: [id number] PT Time Calculation (min) (ACUTE ONLY): 15 min  Charges:  $Gait Training: 8-22 mins                    G Codes:      Conni SlipperKirkman, Erice Ahles 03/18/2015, 2:41 PM   Conni SlipperLaura Cristo Ausburn, PT, DPT Acute Rehabilitation Services Pager: [phone]

## 2015-03-18 NOTE — Progress Notes (Signed)
  Echocardiogram 2D Echocardiogram has been performed.  Leta JunglingCooper, Jermar Colter M 03/18/2015, 10:51 AM

## 2015-03-18 NOTE — Progress Notes (Signed)
Pt discharged from hospital per MD orders. Pt and niece educated on discharge instructions. Pt and niece stated understanding of instructions. Pt's IV removed from arm before discharge. All questions and concerns answered.

## 2015-03-18 NOTE — Clinical Social Work Note (Signed)
CSW informed that pt SNF declined. CSW will sign off.    Clinical Social Worker  Breindy Meadow, MSW, LCSW [phone]

## 2015-03-18 NOTE — Care Management Note (Addendum)
Case Management Note  Patient Details  Name: Cindy CaffeyBarbara C Gibbs MRN: [id number] Date of Birth: 02-06-1941  Subjective/Objective:                    Action/Plan: Spoke with Vicky at [facility], who [facility].  Bedside RN updated. Addendum: Received a call from OT stating that patient will need a wheelchair for home. Bedside RN obtained order and [facility] was contacted and will deliver the wheelchair to the room prior to discharge today. Expected Discharge Date:                  Expected Discharge Plan:  Home w Home Health Services  In-House Referral:  Clinical Social Work  Discharge planning Services  CM Consult  Post Acute Care Choice:    Choice offered to:  Patient  DME Arranged:    wheelchair [facility]  HH Arranged:  PT, OT, Nurse's Aide HH Agency:  [facility]  Status of Service:  Completed, signed off  Medicare Important Message Given:    Date Medicare IM Given:    Medicare IM give by:    Date Additional Medicare IM Given:    Additional Medicare Important Message give by:     If discussed at Long Length of Stay Meetings, dates discussed:    Additional Comments:  Anda KraftRobarge, Vestal Crandall C, RN 03/18/2015, 1:56 PM [phone]

## 2015-03-18 NOTE — Progress Notes (Signed)
VASCULAR LAB PRELIMINARY  PRELIMINARY  PRELIMINARY  PRELIMINARY  Bilateral lower extremity venous duplex completed.    Preliminary report:  Bilateral:  No evidence of DVT, superficial thrombosis, or Baker's Cyst.   Alyx Gee, RVS 03/18/2015, 11:46 AM

## 2015-03-18 NOTE — Discharge Summary (Signed)
Physician Discharge Summary  Cindy CaffeyBarbara C Martensen ZOX:[id number] DOB: [birth year] DOA: 03/17/2015  PCP: Tacey RuizLLESCHEL, EMILY, FNP  Admit date: 03/17/2015 Discharge date: 03/18/2015  Time spent: 60 minutes  Recommendations for Outpatient Follow-up:  1. Check INR in 1 wk  Discharge Condition: stable  Discharge Diagnoses:  Principal Problem:   Acute encephalopathy Active Problems:   Essential hypertension   GERD (gastroesophageal reflux disease)   Back pain   Bilateral knee pain   OSA (obstructive sleep apnea)   Obesity   Atrial fibrillation (HCC)   Depression   Right sided weakness   HLD (hyperlipidemia)   UTI (urinary tract infection)   History of present illness:  Cindy Gibbs is a 74 y.o. female with chronic back pain on Oxycodone, HTN, A- fib on Coumadin, CHF (unspecified) who brought in to the hospital for confusion for the past 2 days. [facility]. Her family states that her confusion is most likely secondary to Lyrica. However after further discussion, I have determined that Lyrica was started 3 months ago and the patient discontinued it a little over a week ago. Asked her confusion just started 2 days ago, I'm doubtful that it was related to the Lyrica.  Hospital Course:  Principal Problem: Acute encephalopathy -MRI of the brain negative -primary concern was that the patient is receiving oxycodone (20 mg tabs) at home and she may have taken an excess of what is prescribed -Doubtful that a UTI could've caused the confusion as she was quite oriented by the time she was evaluated in our ER and she had not yet received treatment for the UTI -Encephalopathy now resolved-have resumed oxycodone while carefully monitoring mental status - continues to be stable mentally - have discussed judiciously using narcotics  Active Problems: Dehydration- ? Acute renal insufficiency -Elevated  BUN/creatinine ratio on admission but no old lab work to compare with-creatinine was 1.10 with a calculated GFR of 49 -Patient is on furosemide at home for a diagnosis of congestive heart failure therefore have held off on starting fluids - Cr improved to 0.89 today  Congestive heart failure-unspecified - 2-D echo ordered to determine the extent of heart failure - EF 55-60%- no diastolic CHF- mild to mod MR and mod TR- mild to mod pulm HTN   Essential hypertension -Continue bisoprolol/HCTZ   GERD (gastroesophageal reflux disease) -Continue PPI   Back pain/ Bilateral knee pain -As mentioned above, advised to use Oxycodone carefully   OSA (obstructive sleep apnea) - CPAP at bedtime   Obesity   Atrial fibrillation - on Coumadin and Bisoprolol  Acquired coagulopathy - hold Coumadin today as INR slightly elevated- check INR in 1 wk  UTI -Admited to symptoms of dysuria - UA at [facility] was suggestive of a UTI- given Fosfomycin    Discharge Exam: Filed Weights   03/17/15 0229  Weight: 118.616 kg (261 lb 8 oz)   Filed Vitals:   03/18/15 1346  BP: 116/53  Pulse: 86  Temp: 98.3 F (36.8 C)  Resp: 18    General: AAO x 3, no distress Cardiovascular: RRR, no murmurs  Respiratory: clear to auscultation bilaterally GI: soft, non-tender, non-distended, bowel sound positive  Discharge Instructions You were cared for by a hospitalist during your hospital stay. If you have any questions about your discharge medications or the care you received while you were in the hospital after you are discharged, you can call the unit and  asked to speak with the hospitalist on call if the hospitalist that took care of you is not available. Once you are discharged, your primary care physician will handle any further medical issues. Please note that NO REFILLS for any discharge medications will be authorized once you are discharged, as it is imperative that you return to your primary  care physician (or establish a relationship with a primary care physician if you do not have one) for your aftercare needs so that they can reassess your need for medications and monitor your lab values.      Discharge Instructions    Diet - low sodium heart healthy    Complete by:  As directed      Discharge instructions    Complete by:  As directed   Skip Warfarin tonight and resume tomorrow- have INR checked in 1 week.     Increase activity slowly    Complete by:  As directed             Medication List    STOP taking these medications        warfarin 2.5 MG tablet  Commonly known as:  COUMADIN      TAKE these medications        bisoprolol-hydrochlorothiazide 5-6.25 MG tablet  Commonly known as:  ZIAC  Take 1 tablet by mouth daily.     cyanocobalamin 1000 MCG/ML injection  Commonly known as:  (VITAMIN B-12)  Inject 1,000 mcg into the muscle every 30 (thirty) days.     furosemide 20 MG tablet  Commonly known as:  LASIX  Take 20 mg by mouth daily.     lidocaine 5 % ointment  Commonly known as:  XYLOCAINE  Apply 1 application topically as needed for mild pain.     lovastatin 40 MG tablet  Commonly known as:  MEVACOR  Take 40 mg by mouth daily.     nitroGLYCERIN 0.4 MG SL tablet  Commonly known as:  NITROSTAT  1 at onset of chest pain, SL. May repeat in 5 minutes if no relief - call 911. May take up to 3 every 5 minutes prn     omeprazole 20 MG capsule  Commonly known as:  PRILOSEC  Take 20 mg by mouth daily.     Oxycodone HCl 20 MG Tabs  Take 20 mg by mouth every 4 (four) hours as needed (pain).     pregabalin 150 MG capsule  Commonly known as:  LYRICA  Take 150 mg by mouth 3 (three) times daily as needed.     sertraline 100 MG tablet  Commonly known as:  ZOLOFT  Take 200 mg by mouth daily.     VITAMIN D-1000 MAX ST 1000 UNITS tablet  Generic drug:  Cholecalciferol  Take 1,000 Units by mouth daily.       No Known Allergies Follow-up Information     Follow up with [facility]   Specialty:  Home Health Services   Contact information:   [address] [place] [address] [phone]        The results of significant diagnostics from this hospitalization (including imaging, microbiology, ancillary and laboratory) are listed below for reference.    Significant Diagnostic Studies: Mr Lodema Pilot Contrast  2015-04-08  CLINICAL DATA:  74 year old female transferred from [facility] with altered mental status and left side weakness. Acute encephalopathy. Initial encounter. EXAM: MRI HEAD WITHOUT AND WITH CONTRAST TECHNIQUE: Multiplanar, multiecho pulse sequences of the brain and  surrounding structures were obtained without and with intravenous contrast. CONTRAST:  10 mL MultiHance COMPARISON:  [facility] Noncontrast head CT 03/16/2015. FINDINGS: Cerebral volume is within normal limits for age. No restricted diffusion to suggest acute infarction. No midline shift, mass effect, evidence of mass lesion, ventriculomegaly, extra-axial collection or acute intracranial hemorrhage. Cervicomedullary junction and pituitary are within normal limits. Major intracranial vascular flow voids are preserved. Mild for age scattered and patchy cerebral white matter T2 and FLAIR hyperintensity in a nonspecific configuration. No cortical encephalomalacia or chronic cerebral blood products. Deep gray matter nuclei, brainstem, and cerebellum within normal limits. No abnormal enhancement identified. Grossly normal visualized internal auditory structures. Mastoids are clear. Trace paranasal sinus mucosal thickening. Negative visualized orbit and scalp soft tissues. Bone marrow signal within normal limits. Negative visualized cervical spine. IMPRESSION: No acute intracranial abnormality. Mild for age nonspecific cerebral white matter signal changes, most commonly due to chronic small vessel disease. Electronically Signed   By: Odessa Fleming M.D.   On:  03/17/2015 10:17    Microbiology: Recent Results (from the past 240 hour(s))  Urine culture     Status: None   Collection Time: 03/17/15  9:52 AM  Result Value Ref Range Status   Specimen Description URINE, CLEAN CATCH  Final   Special Requests NONE  Final   Culture MULTIPLE SPECIES PRESENT, SUGGEST RECOLLECTION  Final   Report Status 03/18/2015 FINAL  Final     Labs: Basic Metabolic Panel:  Recent Labs Lab 03/17/15 0535 03/18/15 0901  NA 137 141  K 4.4 4.2  CL 101 101  CO2 30 30  GLUCOSE 101* 100*  BUN 32* 25*  CREATININE 1.10* 0.89  CALCIUM 9.5 9.5   Liver Function Tests: No results for input(s): AST, ALT, ALKPHOS, BILITOT, PROT, ALBUMIN in the last 168 hours. No results for input(s): LIPASE, AMYLASE in the last 168 hours. No results for input(s): AMMONIA in the last 168 hours. CBC:  Recent Labs Lab 03/17/15 0535  WBC 8.8  HGB 10.2*  HCT 33.1*  MCV 83.8  PLT 195   Cardiac Enzymes: No results for input(s): CKTOTAL, CKMB, CKMBINDEX, TROPONINI in the last 168 hours. BNP: BNP (last 3 results) No results for input(s): BNP in the last 8760 hours.  ProBNP (last 3 results) No results for input(s): PROBNP in the last 8760 hours.  CBG: No results for input(s): GLUCAP in the last 168 hours.     SignedCalvert Cantor, MD [facility] 03/18/2015, 4:08 PM

## 2015-03-18 NOTE — Progress Notes (Signed)
Utilization review completed. Rashed Edler, RN, BSN. 

## 2018-10-25 ENCOUNTER — Emergency Department (HOSPITAL_COMMUNITY): Payer: Medicare Other

## 2018-10-25 ENCOUNTER — Encounter (HOSPITAL_COMMUNITY): Payer: Self-pay | Admitting: Emergency Medicine

## 2018-10-25 ENCOUNTER — Inpatient Hospital Stay (HOSPITAL_COMMUNITY)
Admission: EM | Admit: 2018-10-25 | Discharge: 2018-10-30 | DRG: 481 | Disposition: A | Discharge status: Skilled Nursing Facility | Payer: Medicare Other | Attending: Internal Medicine | Admitting: Internal Medicine

## 2018-10-25 ENCOUNTER — Other Ambulatory Visit: Payer: Self-pay

## 2018-10-25 DIAGNOSIS — Z1159 Encounter for screening for other viral diseases: Secondary | ICD-10-CM | POA: Diagnosis not present

## 2018-10-25 DIAGNOSIS — G4733 Obstructive sleep apnea (adult) (pediatric): Secondary | ICD-10-CM | POA: Diagnosis present

## 2018-10-25 DIAGNOSIS — I10 Essential (primary) hypertension: Secondary | ICD-10-CM | POA: Diagnosis not present

## 2018-10-25 DIAGNOSIS — S72012A Unspecified intracapsular fracture of left femur, initial encounter for closed fracture: Principal | ICD-10-CM | POA: Diagnosis present

## 2018-10-25 DIAGNOSIS — Z9049 Acquired absence of other specified parts of digestive tract: Secondary | ICD-10-CM

## 2018-10-25 DIAGNOSIS — Z801 Family history of malignant neoplasm of trachea, bronchus and lung: Secondary | ICD-10-CM | POA: Diagnosis not present

## 2018-10-25 DIAGNOSIS — N183 Chronic kidney disease, stage 3 unspecified: Secondary | ICD-10-CM | POA: Diagnosis present

## 2018-10-25 DIAGNOSIS — N390 Urinary tract infection, site not specified: Secondary | ICD-10-CM | POA: Diagnosis present

## 2018-10-25 DIAGNOSIS — F325 Major depressive disorder, single episode, in full remission: Secondary | ICD-10-CM | POA: Diagnosis not present

## 2018-10-25 DIAGNOSIS — W19XXXA Unspecified fall, initial encounter: Secondary | ICD-10-CM

## 2018-10-25 DIAGNOSIS — E785 Hyperlipidemia, unspecified: Secondary | ICD-10-CM | POA: Diagnosis present

## 2018-10-25 DIAGNOSIS — F320 Major depressive disorder, single episode, mild: Secondary | ICD-10-CM | POA: Diagnosis not present

## 2018-10-25 DIAGNOSIS — M25552 Pain in left hip: Secondary | ICD-10-CM | POA: Diagnosis present

## 2018-10-25 DIAGNOSIS — R509 Fever, unspecified: Secondary | ICD-10-CM | POA: Insufficient documentation

## 2018-10-25 DIAGNOSIS — W1830XA Fall on same level, unspecified, initial encounter: Secondary | ICD-10-CM | POA: Diagnosis present

## 2018-10-25 DIAGNOSIS — F32A Depression, unspecified: Secondary | ICD-10-CM | POA: Diagnosis present

## 2018-10-25 DIAGNOSIS — K210 Gastro-esophageal reflux disease with esophagitis: Secondary | ICD-10-CM | POA: Diagnosis not present

## 2018-10-25 DIAGNOSIS — I4821 Permanent atrial fibrillation: Secondary | ICD-10-CM | POA: Diagnosis not present

## 2018-10-25 DIAGNOSIS — Z8249 Family history of ischemic heart disease and other diseases of the circulatory system: Secondary | ICD-10-CM

## 2018-10-25 DIAGNOSIS — I5032 Chronic diastolic (congestive) heart failure: Secondary | ICD-10-CM | POA: Diagnosis present

## 2018-10-25 DIAGNOSIS — F329 Major depressive disorder, single episode, unspecified: Secondary | ICD-10-CM | POA: Diagnosis present

## 2018-10-25 DIAGNOSIS — S72002A Fracture of unspecified part of neck of left femur, initial encounter for closed fracture: Secondary | ICD-10-CM | POA: Diagnosis not present

## 2018-10-25 DIAGNOSIS — Z90710 Acquired absence of both cervix and uterus: Secondary | ICD-10-CM

## 2018-10-25 DIAGNOSIS — Z79899 Other long term (current) drug therapy: Secondary | ICD-10-CM | POA: Diagnosis not present

## 2018-10-25 DIAGNOSIS — Z6841 Body Mass Index (BMI) 40.0 and over, adult: Secondary | ICD-10-CM

## 2018-10-25 DIAGNOSIS — Z885 Allergy status to narcotic agent status: Secondary | ICD-10-CM | POA: Diagnosis not present

## 2018-10-25 DIAGNOSIS — Z7901 Long term (current) use of anticoagulants: Secondary | ICD-10-CM

## 2018-10-25 DIAGNOSIS — N3000 Acute cystitis without hematuria: Secondary | ICD-10-CM | POA: Diagnosis not present

## 2018-10-25 DIAGNOSIS — K219 Gastro-esophageal reflux disease without esophagitis: Secondary | ICD-10-CM | POA: Diagnosis present

## 2018-10-25 DIAGNOSIS — I130 Hypertensive heart and chronic kidney disease with heart failure and stage 1 through stage 4 chronic kidney disease, or unspecified chronic kidney disease: Secondary | ICD-10-CM | POA: Diagnosis present

## 2018-10-25 DIAGNOSIS — Z79891 Long term (current) use of opiate analgesic: Secondary | ICD-10-CM | POA: Diagnosis not present

## 2018-10-25 DIAGNOSIS — T148XXA Other injury of unspecified body region, initial encounter: Secondary | ICD-10-CM

## 2018-10-25 DIAGNOSIS — Y92009 Unspecified place in unspecified non-institutional (private) residence as the place of occurrence of the external cause: Secondary | ICD-10-CM

## 2018-10-25 DIAGNOSIS — I480 Paroxysmal atrial fibrillation: Secondary | ICD-10-CM | POA: Diagnosis present

## 2018-10-25 DIAGNOSIS — I4891 Unspecified atrial fibrillation: Secondary | ICD-10-CM | POA: Diagnosis present

## 2018-10-25 LAB — CBC WITH DIFFERENTIAL/PLATELET
Abs Immature Granulocytes: 0.04 10*3/uL (ref 0.00–0.07)
Basophils Absolute: 0.1 10*3/uL (ref 0.0–0.1)
Basophils Relative: 1 %
Eosinophils Absolute: 0.3 10*3/uL (ref 0.0–0.5)
Eosinophils Relative: 3 %
HCT: 36.4 % (ref 36.0–46.0)
Hemoglobin: 11 g/dL — ABNORMAL LOW (ref 12.0–15.0)
Immature Granulocytes: 0 %
Lymphocytes Relative: 8 %
Lymphs Abs: 0.8 10*3/uL (ref 0.7–4.0)
MCH: 27.4 pg (ref 26.0–34.0)
MCHC: 30.2 g/dL (ref 30.0–36.0)
MCV: 90.8 fL (ref 80.0–100.0)
Monocytes Absolute: 0.8 10*3/uL (ref 0.1–1.0)
Monocytes Relative: 8 %
Neutro Abs: 8.1 10*3/uL — ABNORMAL HIGH (ref 1.7–7.7)
Neutrophils Relative %: 80 %
Platelets: 165 10*3/uL (ref 150–400)
RBC: 4.01 MIL/uL (ref 3.87–5.11)
RDW: 15.9 % — ABNORMAL HIGH (ref 11.5–15.5)
WBC: 10.1 10*3/uL (ref 4.0–10.5)
nRBC: 0 % (ref 0.0–0.2)

## 2018-10-25 LAB — COMPREHENSIVE METABOLIC PANEL
ALT: 73 U/L — ABNORMAL HIGH (ref 0–44)
AST: 77 U/L — ABNORMAL HIGH (ref 15–41)
Albumin: 3.5 g/dL (ref 3.5–5.0)
Alkaline Phosphatase: 131 U/L — ABNORMAL HIGH (ref 38–126)
Anion gap: 9 (ref 5–15)
BUN: 25 mg/dL — ABNORMAL HIGH (ref 8–23)
CO2: 26 mmol/L (ref 22–32)
Calcium: 9.3 mg/dL (ref 8.9–10.3)
Chloride: 102 mmol/L (ref 98–111)
Creatinine, Ser: 1.16 mg/dL — ABNORMAL HIGH (ref 0.44–1.00)
GFR calc Af Amer: 53 mL/min — ABNORMAL LOW (ref >60)
GFR calc non Af Amer: 46 mL/min — ABNORMAL LOW (ref >60)
Glucose, Bld: 112 mg/dL — ABNORMAL HIGH (ref 70–99)
Potassium: 4.5 mmol/L (ref 3.5–5.1)
Sodium: 137 mmol/L (ref 135–145)
Total Bilirubin: 1.5 mg/dL — ABNORMAL HIGH (ref 0.3–1.2)
Total Protein: 7.6 g/dL (ref 6.5–8.1)

## 2018-10-25 LAB — BRAIN NATRIURETIC PEPTIDE: B Natriuretic Peptide: 471.2 pg/mL — ABNORMAL HIGH (ref 0.0–100.0)

## 2018-10-25 LAB — URINALYSIS, ROUTINE W REFLEX MICROSCOPIC
Bilirubin Urine: NEGATIVE
Glucose, UA: NEGATIVE mg/dL
Ketones, ur: NEGATIVE mg/dL
Nitrite: NEGATIVE
Protein, ur: 30 mg/dL — AB
Specific Gravity, Urine: 1.019 (ref 1.005–1.030)
pH: 5 (ref 5.0–8.0)

## 2018-10-25 LAB — I-STAT TROPONIN, ED: Troponin i, poc: 0.01 ng/mL (ref 0.00–0.08)

## 2018-10-25 LAB — APTT: aPTT: 39 seconds — ABNORMAL HIGH (ref 24–36)

## 2018-10-25 LAB — PROTIME-INR
INR: 1.2 (ref 0.8–1.2)
Prothrombin Time: 15.3 seconds — ABNORMAL HIGH (ref 11.4–15.2)

## 2018-10-25 LAB — SARS CORONAVIRUS 2 BY RT PCR (HOSPITAL ORDER, PERFORMED IN ~~LOC~~ HOSPITAL LAB): SARS Coronavirus 2: NEGATIVE

## 2018-10-25 LAB — LACTIC ACID, PLASMA
Lactic Acid, Venous: 0.9 mmol/L (ref 0.5–1.9)
Lactic Acid, Venous: 0.7 mmol/L (ref 0.5–1.9)

## 2018-10-25 LAB — TYPE AND SCREEN
ABO/RH(D): A POS
Antibody Screen: NEGATIVE

## 2018-10-25 LAB — CK: Total CK: 21 U/L — ABNORMAL LOW (ref 38–234)

## 2018-10-25 MED ORDER — NITROGLYCERIN 0.4 MG SL SUBL: 0.4000 mg | SUBLINGUAL_TABLET | SUBLINGUAL | Status: DC | PRN

## 2018-10-25 MED ORDER — FUROSEMIDE 20 MG PO TABS: 20.0000 mg | ORAL_TABLET | Freq: Every day | ORAL | Status: DC

## 2018-10-25 MED ORDER — SODIUM CHLORIDE 0.9 % IV SOLN: 1.0000 g | Freq: Once | INTRAVENOUS | Status: DC

## 2018-10-25 MED ORDER — METOPROLOL SUCCINATE ER 50 MG PO TB24
50.0000 mg | ORAL_TABLET | Freq: Two times a day (BID) | ORAL | Status: DC
  Administered 2018-10-26: 50 mg via ORAL
  Filled 2018-10-25 (×2): qty 1

## 2018-10-25 MED ORDER — MORPHINE SULFATE (PF) 2 MG/ML IV SOLN: 1.0000 mg | INTRAVENOUS | Status: DC | PRN

## 2018-10-25 MED ORDER — SODIUM CHLORIDE 0.9 % IV BOLUS
1000.0000 mL | Freq: Once | INTRAVENOUS | Status: AC
  Administered 2018-10-25: 1000 mL via INTRAVENOUS

## 2018-10-25 MED ORDER — SENNOSIDES-DOCUSATE SODIUM 8.6-50 MG PO TABS: 1.0000 | ORAL_TABLET | Freq: Every evening | ORAL | Status: DC | PRN

## 2018-10-25 MED ORDER — PREGABALIN 25 MG PO CAPS
75.0000 mg | ORAL_CAPSULE | Freq: Two times a day (BID) | ORAL | Status: DC
  Administered 2018-10-25 – 2018-10-30 (×9): 75 mg via ORAL
  Filled 2018-10-25 (×10): qty 3

## 2018-10-25 MED ORDER — MORPHINE SULFATE (PF) 4 MG/ML IV SOLN
4.0000 mg | Freq: Once | INTRAVENOUS | Status: AC
  Administered 2018-10-25: 21:00:00 4 mg via INTRAVENOUS
  Filled 2018-10-25: qty 1

## 2018-10-25 MED ORDER — METHOCARBAMOL 500 MG PO TABS
500.0000 mg | ORAL_TABLET | Freq: Three times a day (TID) | ORAL | Status: DC | PRN
  Administered 2018-10-26 – 2018-10-30 (×4): 500 mg via ORAL
  Filled 2018-10-25 (×4): qty 1

## 2018-10-25 MED ORDER — ALBUTEROL SULFATE (2.5 MG/3ML) 0.083% IN NEBU: 2.5000 mg | INHALATION_SOLUTION | RESPIRATORY_TRACT | Status: DC | PRN

## 2018-10-25 MED ORDER — ACETAMINOPHEN 500 MG PO TABS
1000.0000 mg | ORAL_TABLET | Freq: Once | ORAL | Status: AC
  Administered 2018-10-25: 1000 mg via ORAL
  Filled 2018-10-25: qty 2

## 2018-10-25 MED ORDER — HYDRALAZINE HCL 20 MG/ML IJ SOLN: 5.0000 mg | INTRAMUSCULAR | Status: DC | PRN

## 2018-10-25 MED ORDER — PRAVASTATIN SODIUM 40 MG PO TABS
40.0000 mg | ORAL_TABLET | Freq: Every day | ORAL | Status: DC
  Administered 2018-10-27 – 2018-10-29 (×2): 40 mg via ORAL
  Filled 2018-10-25 (×3): qty 1

## 2018-10-25 MED ORDER — SERTRALINE HCL 100 MG PO TABS
200.0000 mg | ORAL_TABLET | Freq: Every day | ORAL | Status: DC
  Administered 2018-10-25 – 2018-10-29 (×5): 200 mg via ORAL
  Filled 2018-10-25 (×5): qty 2

## 2018-10-25 MED ORDER — PANTOPRAZOLE SODIUM 40 MG PO TBEC
40.0000 mg | DELAYED_RELEASE_TABLET | Freq: Every day | ORAL | Status: DC
  Administered 2018-10-26 – 2018-10-30 (×5): 40 mg via ORAL
  Filled 2018-10-25 (×5): qty 1

## 2018-10-25 MED ORDER — BUPROPION HCL ER (XL) 150 MG PO TB24
300.0000 mg | ORAL_TABLET | Freq: Every morning | ORAL | Status: DC
  Administered 2018-10-26 – 2018-10-30 (×5): 300 mg via ORAL
  Filled 2018-10-25 (×5): qty 2

## 2018-10-25 MED ORDER — ONDANSETRON HCL 4 MG/2ML IJ SOLN
4.0000 mg | Freq: Three times a day (TID) | INTRAMUSCULAR | Status: DC | PRN
  Administered 2018-10-28: 4 mg via INTRAVENOUS
  Filled 2018-10-25: qty 2

## 2018-10-25 MED ORDER — ACETAMINOPHEN 325 MG PO TABS
325.0000 mg | ORAL_TABLET | Freq: Four times a day (QID) | ORAL | Status: DC | PRN
  Administered 2018-10-29: 325 mg via ORAL
  Filled 2018-10-25: qty 1

## 2018-10-25 MED ORDER — DIPHENHYDRAMINE HCL 25 MG PO CAPS: 25.0000 mg | ORAL_CAPSULE | Freq: Four times a day (QID) | ORAL | Status: DC | PRN

## 2018-10-25 MED ORDER — BISOPROLOL-HYDROCHLOROTHIAZIDE 5-6.25 MG PO TABS
1.0000 | ORAL_TABLET | Freq: Every day | ORAL | Status: DC
  Administered 2018-10-27 – 2018-10-30 (×4): 1 via ORAL
  Filled 2018-10-25 (×5): qty 1

## 2018-10-25 MED ORDER — OXYCODONE-ACETAMINOPHEN 5-325 MG PO TABS
1.0000 | ORAL_TABLET | ORAL | Status: DC | PRN
  Administered 2018-10-26 – 2018-10-30 (×8): 1 via ORAL
  Filled 2018-10-25 (×9): qty 1

## 2018-10-25 MED ORDER — SODIUM CHLORIDE 0.9 % IV SOLN
1.0000 g | INTRAVENOUS | Status: DC
  Administered 2018-10-25 – 2018-10-28 (×4): 1 g via INTRAVENOUS
  Filled 2018-10-25 (×5): qty 10

## 2018-10-25 NOTE — H&P (Signed)
History and Physical    Cindy Gibbs ZOX:[id number] DOB: 1940/08/18 DOA: 10/25/2018  Referring MD/NP/PA:   PCP: Tacey Ruiz, FNP   Patient coming from:  The patient is coming from home.  At baseline, pt is independent for most of ADL.        Chief Complaint: fall and left hip, fever, dysuria  HPI: Cindy Gibbs is a 78 y.o. female with medical history significant of A. fib on Eliquis, hyperlipidemia, GERD, CHF, OSA, CKD-3, who presents with fall and left hip pain.  Per EMS, pt fell on Wednesday. EMS was dispatched, but pt refused transport. Upon waking this AM, pt has been having pain in her left hip and leg. No LOC. She pt states that she is achy all over especially in her hip and both legs.  The pain is constant, severe, sharp, nonradiating. She also has fever and chills, and generalized weakness. Patient states that she has mild chronic dry cough, which has not changed.  No shortness of breath or chest pain.  She reports dysuria, mild burning on urination and increased urinary frequency recently.  ED Course: pt was found to have WBC 10.1, negative COVID-19 test, lactic acid of 0.9, INR 1.2, stable renal function, CK 21, urinalysis (hazy appearance, trace amount of leukocyte, many bacteria, WBC 0-5), temperature 100.6, bradycardia, RR 22, oxygen sat 92 to 99% on room air, blood pressure 112/74.  Chest x-ray negative.  X-ray of left femur and pelvis showed nondisplaced left femoral neck fracture.  X-ray of bilateral tibia/fibula negative.  X-ray of right femur negative. Pt is admitted to telemetry bed as inpatient.  Orthopedic surgeon, Dr. Jena Gauss was consulted.  Review of Systems:   General: has fevers, chills, no body weight gain, has poor appetite, has fatigue HEENT: no blurry vision, hearing changes or sore throat Respiratory: no dyspnea, coughing, wheezing CV: no chest pain, no palpitations GI: no nausea, vomiting, abdominal pain, diarrhea, constipation GU: no dysuria,  burning on urination, increased urinary frequency, hematuria  Ext: no leg edema Neuro: no unilateral weakness, numbness, or tingling, no vision change or hearing loss. Had fall. Skin: no rash, no skin tear. MSK: has pain in left hip and both legs Heme: No easy bruising.  Travel history: No recent long distant travel.  Allergy:  Allergies  Allergen Reactions   Codeine Nausea Only    Past Medical History:  Diagnosis Date   Atrial fibrillation (HCC)    Back pain    Bilateral knee pain    CHF (congestive heart failure) (HCC)    Depression    Essential hypertension    GERD (gastroesophageal reflux disease)    HLD (hyperlipidemia)    Obesity    OSA (obstructive sleep apnea)     Past Surgical History:  Procedure Laterality Date   ABDOMINAL HYSTERECTOMY     CHOLECYSTECTOMY      Social History:  reports that she has never smoked. She has never used smokeless tobacco. No history on file for alcohol and drug.  Family History:  Family History  Problem Relation Age of Onset   Lung cancer Mother    Heart disease Father    Cirrhosis Brother      Prior to Admission medications   Medication Sig Start Date End Date Taking? Authorizing Provider  acetaminophen (TYLENOL) 325 MG tablet Take 325 mg by mouth every 6 (six) hours as needed for mild pain or headache.  11/15/16  Yes [provider]  albuterol (PROAIR HFA) 108 (90 Base) MCG/ACT  inhaler Inhale 2 puffs into the lungs every 6 (six) hours as needed for wheezing or shortness of breath.   Yes [provider]  albuterol (PROVENTIL) (2.5 MG/3ML) 0.083% nebulizer solution Take 2.5 mg by nebulization every 6 (six) hours as needed for wheezing or shortness of breath.  06/21/16  Yes [provider]  apixaban (ELIQUIS) 5 MG TABS tablet Take 5 mg by mouth 2 (two) times daily.   Yes [provider]  bisoprolol-hydrochlorothiazide (ZIAC) 5-6.25 MG tablet Take 1 tablet by mouth daily. 03/09/15  Yes  [provider]  buPROPion (WELLBUTRIN XL) 150 MG 24 hr tablet Take 300 mg by mouth every morning.   Yes [provider]  diphenhydrAMINE (BENADRYL) 25 mg capsule Take 25 mg by mouth every 6 (six) hours as needed for allergies (or nasal drainage).    Yes [provider]  furosemide (LASIX) 20 MG tablet Take 20 mg by mouth daily.   Yes [provider]  lidocaine (XYLOCAINE) 5 % ointment Apply 1 application topically as needed for mild pain (to affected site).    Yes [provider]  lovastatin (MEVACOR) 40 MG tablet Take 40 mg by mouth at bedtime.  02/15/15  Yes [provider]  metoprolol succinate (TOPROL-XL) 50 MG 24 hr tablet Take 50 mg by mouth 2 (two) times a day. Take with or immediately following a meal.   Yes [provider]  nitroGLYCERIN (NITROSTAT) 0.4 MG SL tablet Place 0.4 mg under the tongue every 5 (five) minutes x 3 doses as needed (CALL 9-1-1, if no relief).  07/01/14  Yes [provider]  omeprazole (PRILOSEC) 20 MG capsule Take 20 mg by mouth 2 (two) times a day.  02/15/15  Yes [provider]  Oxycodone HCl 20 MG TABS Take 20 mg by mouth every 4 (four) hours as needed (for pain).    Yes [provider]  pregabalin (LYRICA) 75 MG capsule Take 75 mg by mouth 2 (two) times daily.   Yes [provider]  sertraline (ZOLOFT) 100 MG tablet Take 200 mg by mouth at bedtime.  03/09/15  Yes [provider]  traMADol (ULTRAM) 50 MG tablet Take 50 mg by mouth daily as needed (for pain).   Yes [provider]  cyanocobalamin (,VITAMIN B-12,) 1000 MCG/ML injection Inject 1,000 mcg into the muscle every 30 (thirty) days. 11/04/13   [provider]    Physical Exam: Vitals:   10/25/18 2115 10/25/18 2130 10/25/18 2208 10/25/18 2332  BP: 107/78 119/67 114/67 102/67  Pulse: 80 93 68 63  Resp:   16 16  Temp:   98.1 F (36.7 C) 97.9 F (36.6 C)  TempSrc:   Oral Oral  SpO2:  93% 94% 92% 93%  Weight:   108.4 kg   Height:   5\' 4"  (1.626 m)    General: Not in acute distress HEENT:       Eyes: PERRL, EOMI, no scleral icterus.       ENT: No discharge from the ears and nose, no pharynx injection, no tonsillar enlargement.        Neck: No JVD, no bruit, no mass felt. Heme: No neck lymph node enlargement. Cardiac: S1/S2, RRR, No murmurs, No gallops or rubs. Respiratory: No rales, wheezing, rhonchi or rubs. GI: Soft, nondistended, nontender, no rebound pain, no organomegaly, BS present. GU: No hematuria Ext: No pitting leg edema bilaterally. 2+DP/PT pulse bilaterally. Musculoskeletal: has tenderness in left hip and both legs Skin: No rashes.  Neuro: Alert, oriented X3, cranial nerves II-XII grossly intact, moves all extremities normally.  Psych: Patient is not psychotic, no suicidal or hemocidal ideation.  Labs on Admission: I have personally reviewed following labs and imaging studies  CBC: Recent Labs  Lab 10/25/18 1558  WBC 10.1  NEUTROABS 8.1*  HGB 11.0*  HCT 36.4  MCV 90.8  PLT 165   Basic Metabolic Panel: Recent Labs  Lab 10/25/18 1558  NA 137  K 4.5  CL 102  CO2 26  GLUCOSE 112*  BUN 25*  CREATININE 1.16*  CALCIUM 9.3   GFR: Estimated Creatinine Clearance: 49.6 mL/min (A) (by C-G formula based on SCr of 1.16 mg/dL (H)). Liver Function Tests: Recent Labs  Lab 10/25/18 1558  AST 77*  ALT 73*  ALKPHOS 131*  BILITOT 1.5*  PROT 7.6  ALBUMIN 3.5   No results for input(s): LIPASE, AMYLASE in the last 168 hours. No results for input(s): AMMONIA in the last 168 hours. Coagulation Profile: Recent Labs  Lab 10/25/18 1737  INR 1.2   Cardiac Enzymes: Recent Labs  Lab 10/25/18 1558  CKTOTAL 21*   BNP (last 3 results) No results for input(s): PROBNP in the last 8760 hours. HbA1C: No results for input(s): HGBA1C in the last 72 hours. CBG: No results for input(s): GLUCAP in the last 168 hours. Lipid Profile: No results for  input(s): CHOL, HDL, LDLCALC, TRIG, CHOLHDL, LDLDIRECT in the last 72 hours. Thyroid Function Tests: No results for input(s): TSH, T4TOTAL, FREET4, T3FREE, THYROIDAB in the last 72 hours. Anemia Panel: No results for input(s): VITAMINB12, FOLATE, FERRITIN, TIBC, IRON, RETICCTPCT in the last 72 hours. Urine analysis:    Component Value Date/Time   COLORURINE AMBER (A) 10/25/2018 1923   APPEARANCEUR HAZY (A) 10/25/2018 1923   LABSPEC 1.019 10/25/2018 1923   PHURINE 5.0 10/25/2018 1923   GLUCOSEU NEGATIVE 10/25/2018 1923   HGBUR SMALL (A) 10/25/2018 1923   BILIRUBINUR NEGATIVE 10/25/2018 1923   KETONESUR NEGATIVE 10/25/2018 1923   PROTEINUR 30 (A) 10/25/2018 1923   NITRITE NEGATIVE 10/25/2018 1923   LEUKOCYTESUR TRACE (A) 10/25/2018 1923   Sepsis Labs: @LABRCNTIP (procalcitonin:4,lacticidven:4) ) Recent Results (from the past 240 hour(s))  SARS Coronavirus 2 (CEPHEID - Performed in [facility] hospital lab), Hosp Order     Status: None   Collection Time: 10/25/18  3:59 PM   Specimen: Nasopharyngeal Swab  Result Value Ref Range Status   SARS Coronavirus 2 NEGATIVE NEGATIVE Final    Comment: (NOTE) If result is NEGATIVE SARS-CoV-2 target nucleic acids are NOT DETECTED. The SARS-CoV-2 RNA is generally detectable in upper and lower  respiratory specimens during the acute phase of infection. The lowest  concentration of SARS-CoV-2 viral copies this assay can detect is 250  copies / mL. A negative result does not preclude SARS-CoV-2 infection  and should not be used as the sole basis for treatment or other  patient management decisions.  A negative result may occur with  improper specimen collection / handling, submission of specimen other  than nasopharyngeal swab, presence of viral mutation(s) within the  areas targeted by this assay, and inadequate number of viral copies  (<250 copies / mL). A negative result must be combined with clinical  observations, patient history, and  epidemiological information. If result is POSITIVE SARS-CoV-2 target nucleic acids are DETECTED. The SARS-CoV-2 RNA is generally detectable in upper and lower  respiratory specimens dur ing the acute phase of infection.  Positive  results are indicative of active infection with SARS-CoV-2.  Clinical  correlation with patient history and other diagnostic information is  necessary to determine patient infection status.  Positive results do  not rule out bacterial infection or co-infection with other viruses. If result is PRESUMPTIVE POSTIVE SARS-CoV-2 nucleic acids MAY BE PRESENT.   A presumptive positive result was obtained on the submitted specimen  and confirmed on repeat testing.  While 2019 novel coronavirus  (SARS-CoV-2) nucleic acids may be present in the submitted sample  additional confirmatory testing may be necessary for epidemiological  and / or clinical management purposes  to differentiate between  SARS-CoV-2 and other Sarbecovirus currently known to infect humans.  If clinically indicated additional testing with an alternate test  methodology [phone]([id number]) is advised. The SARS-CoV-2 RNA is generally  detectable in upper and lower respiratory sp ecimens during the acute  phase of infection. The expected result is Negative. Fact Sheet for Patients:  BoilerBrush.com.cyhttps://www.fda.gov/media/136312/download Fact Sheet for Healthcare Providers: https://pope.com/https://www.fda.gov/media/136313/download This test is not yet approved or cleared by the [place] FDA and has been authorized for detection and/or diagnosis of SARS-CoV-2 by FDA under an Emergency Use Authorization (EUA).  This EUA will remain in effect (meaning this test can be used) for the duration of the COVID-19 declaration under Section 564(b)(1) of the Act, 21 U.S.C. section 360bbb-3(b)(1), unless the authorization is terminated or revoked sooner. Performed at [facility], [address] [place], [place] [address]   Surgical PCR  screen     Status: Abnormal   Collection Time: 10/25/18 11:45 PM   Specimen: Nasal Mucosa; Nasal Swab  Result Value Ref Range Status   MRSA, PCR NEGATIVE NEGATIVE Final   Staphylococcus aureus POSITIVE (A) NEGATIVE Final    Comment: (NOTE) The Xpert SA Assay (FDA approved for NASAL specimens in patients 76 years of age and older), is one component of a comprehensive surveillance program. It is not intended to diagnose infection nor to guide or monitor treatment. Performed at [facility], [address] [place], [place] [address]      Radiological Exams on Admission: Dg Chest 1 View  Result Date: 10/25/2018 CLINICAL DATA:  Larey SeatFell 1-2 days ago. Leg and back pain. Left femoral neck fracture. EXAM: CHEST  1 VIEW COMPARISON:  05/08/2018 FINDINGS: Cardiac silhouette is top-normal in size. No mediastinal or hilar masses. No convincing adenopathy. Clear lungs.  No pleural effusion or pneumothorax. Skeletal structures are demineralized but grossly intact. IMPRESSION: No acute cardiopulmonary disease. Electronically Signed   By: Amie Portlandavid  Ormond M.D.   On: 10/25/2018 17:50   Dg Pelvis 1-2 Views  Result Date: 10/25/2018 CLINICAL DATA:  Larey SeatFell 1 or 2 days ago.  Right leg pain. EXAM: PELVIS - 1-2 VIEW COMPARISON:  Abdomen and pelvis CT, 08/19/2008 FINDINGS: There is a non comminuted, subcapital fracture of the left femoral neck, without significant displacement but with valgus angulation. No other fracture.  No bone lesion. Hip joints, SI joints and symphysis pubis are normally aligned. Skeletal structures are diffusely demineralized. They are soft tissue edema adjacent to the fractured left femoral neck. IMPRESSION: 1. Fracture of the left femoral neck as described. No dislocation. No other acute skeletal abnormality. Electronically Signed   By: Amie Portlandavid  Ormond M.D.   On: 10/25/2018 17:44   Dg Tibia/fibula Left  Result Date: 10/25/2018 CLINICAL DATA:  Fall 1-2 days ago.  Left leg pain. EXAM: LEFT TIBIA  AND FIBULA - 2 VIEW COMPARISON:  None. FINDINGS: No fracture or bone lesion. Advanced degenerative changes noted of the knee.  Knee joint normally aligned. Ankle joint is normally aligned. Skeletal structures are diffusely demineralized. Soft tissues are unremarkable. IMPRESSION: No fracture or dislocation. Electronically Signed   By: Amie Portland M.D.   On: 10/25/2018 17:46   Dg Tibia/fibula Right  Result Date: 10/25/2018 CLINICAL DATA:  Fall 1 or 2 days ago.  Right leg pain. EXAM: RIGHT TIBIA AND FIBULA - 2 VIEW COMPARISON:  None. FINDINGS: No fracture.  No bone lesion.  Bones are diffusely demineralized. There are advanced degenerative changes of the knee. Knee joint is normally aligned. Ankle joint is normally aligned. Soft tissues are unremarkable. IMPRESSION: No fracture or dislocation Electronically Signed   By: Amie Portland M.D.   On: 10/25/2018 17:45   Ct Head Wo Contrast  Result Date: 10/25/2018 CLINICAL DATA:  Per EMS pt fell on Wednesday EMS was dispatched and pt refused transport. Upon waking this AM pt experiencing pain in her left hip and leg. Pt has been able to walk since fall until today EXAM: CT HEAD WITHOUT CONTRAST CT CERVICAL SPINE WITHOUT CONTRAST TECHNIQUE: Multidetector CT imaging of the head and cervical spine was performed following the standard protocol without intravenous contrast. Multiplanar CT image reconstructions of the cervical spine were also generated. COMPARISON:  Head CT, 03/16/2015.  Brain MRI, 03/17/2015. FINDINGS: CT HEAD FINDINGS Brain: No evidence of acute infarction, hemorrhage, hydrocephalus, extra-axial collection or mass lesion/mass effect. Mild patchy areas of subcortical and periventricular white matter hypoattenuation consistent with chronic microvascular ischemic change. Vascular: No hyperdense vessel or unexpected calcification. Skull: Normal. Negative for fracture or focal lesion. Sinuses/Orbits: Globes and orbits are unremarkable. Sinuses and mastoid  air cells are clear. Other: None. CT CERVICAL SPINE FINDINGS Alignment: Normal. Skull base and vertebrae: No acute fracture. No primary bone lesion or focal pathologic process. Soft tissues and spinal canal: No prevertebral fluid or swelling. No visible canal hematoma. Heterogeneous appearance of the thyroid suggesting bilateral poorly defined nodules. Measure lies esophagus is dilated. No neck masses or enlarged lymph nodes. Carotid vascular calcifications. Disc levels: Mild loss of disc height at C2-C3 and C3-C4. Moderate to marked loss of disc height at C4-C5 through C7-T1 with mild endplate sclerosis and endplate osteophytes. No convincing disc herniation. Upper chest: There is interstitial thickening with mild interval ground-glass opacities at the lung apices, right greater than left. This may be chronic. Consider congestive heart failure if there are consistent clinical findings. Infection is not excluded. Other: None. IMPRESSION: HEAD CT 1. No acute intracranial abnormalities. 2. No skull fracture. CERVICAL CT 1. No fracture or acute finding. 2. Degenerative changes as detailed. 3. Interstitial thickening and intervening areas of ground-glass opacity at the lung apices, right greater than left. This is consistent with interstitial airspace edema from congestive heart failure in the proper clinical setting. Infection or inflammation could have this appearance. Findings could be chronic. Electronically Signed   By: Amie Portland M.D.   On: 10/25/2018 18:37   Ct Cervical Spine Wo Contrast  Result Date: 10/25/2018 CLINICAL DATA:  Per EMS pt fell on Wednesday EMS was dispatched and pt refused transport. Upon waking this AM pt experiencing pain in her left hip and leg. Pt has been able to walk since fall until today EXAM: CT HEAD WITHOUT CONTRAST CT CERVICAL SPINE WITHOUT CONTRAST TECHNIQUE: Multidetector CT imaging of the head and cervical spine was performed following the standard protocol without  intravenous contrast. Multiplanar CT image reconstructions of the cervical spine were also generated. COMPARISON:  Head CT, 03/16/2015.  Brain MRI,  03/17/2015. FINDINGS: CT HEAD FINDINGS Brain: No evidence of acute infarction, hemorrhage, hydrocephalus, extra-axial collection or mass lesion/mass effect. Mild patchy areas of subcortical and periventricular white matter hypoattenuation consistent with chronic microvascular ischemic change. Vascular: No hyperdense vessel or unexpected calcification. Skull: Normal. Negative for fracture or focal lesion. Sinuses/Orbits: Globes and orbits are unremarkable. Sinuses and mastoid air cells are clear. Other: None. CT CERVICAL SPINE FINDINGS Alignment: Normal. Skull base and vertebrae: No acute fracture. No primary bone lesion or focal pathologic process. Soft tissues and spinal canal: No prevertebral fluid or swelling. No visible canal hematoma. Heterogeneous appearance of the thyroid suggesting bilateral poorly defined nodules. Measure lies esophagus is dilated. No neck masses or enlarged lymph nodes. Carotid vascular calcifications. Disc levels: Mild loss of disc height at C2-C3 and C3-C4. Moderate to marked loss of disc height at C4-C5 through C7-T1 with mild endplate sclerosis and endplate osteophytes. No convincing disc herniation. Upper chest: There is interstitial thickening with mild interval ground-glass opacities at the lung apices, right greater than left. This may be chronic. Consider congestive heart failure if there are consistent clinical findings. Infection is not excluded. Other: None. IMPRESSION: HEAD CT 1. No acute intracranial abnormalities. 2. No skull fracture. CERVICAL CT 1. No fracture or acute finding. 2. Degenerative changes as detailed. 3. Interstitial thickening and intervening areas of ground-glass opacity at the lung apices, right greater than left. This is consistent with interstitial airspace edema from congestive heart failure in the proper  clinical setting. Infection or inflammation could have this appearance. Findings could be chronic. Electronically Signed   By: Amie Portland M.D.   On: 10/25/2018 18:37   Ct Hip Left Wo Contrast  Result Date: 10/25/2018 CLINICAL DATA:  Left hip fracture. EXAM: CT OF THE LEFT HIP WITHOUT CONTRAST TECHNIQUE: Multidetector CT imaging of the left hip was performed according to the standard protocol. Multiplanar CT image reconstructions were also generated. COMPARISON:  Radiographs from 10/25/2018 FINDINGS: Bones/Joint/Cartilage Subcapital left femoral neck fracture noted with mild lateral impaction and mild lateral cortical discontinuity. The adjacent portion of the pelvis appears normal. Little if any hip joint effusion. Ligaments Suboptimally assessed by CT. Muscles and Tendons Atrophic gluteus minimus muscle, not uncommon in this age group. Soft tissues Sigmoid colon diverticulosis. Left common femoral artery atherosclerotic calcification. IMPRESSION: 1. Subcapital left femoral neck fracture noted with mild lateral impaction. 2. Sigmoid colon diverticulosis. 3. Left common femoral artery atherosclerotic calcification. Electronically Signed   By: Gaylyn Rong M.D.   On: 10/25/2018 21:00   Dg Femur Min 2 Views Left  Result Date: 10/25/2018 CLINICAL DATA:  Fall leg pain.  1-2 days ago.  Left EXAM: LEFT FEMUR 2 VIEWS COMPARISON:  None. FINDINGS: There is a subcapital fracture of the left femoral neck. No fracture comminution or significant displacement. There is valgus angulation. No other fractures. No bone lesions. Skeletal structures are demineralized. Hip and knee joints are normally aligned. There are advanced degenerative changes of the left knee joint. Small knee joint effusion. IMPRESSION: 1. Nondisplaced, non comminuted subcapital fracture of the left femoral neck with valgus angulation. 2. No other fractures.  No dislocation Electronically Signed   By: Amie Portland M.D.   On: 10/25/2018 17:49    Dg Femur Min 2 Views Right  Result Date: 10/25/2018 CLINICAL DATA:  Larey Seat 1-2 days ago.  Right leg pain. EXAM: RIGHT FEMUR 2 VIEWS COMPARISON:  None. FINDINGS: No fracture. No bone lesion. The skeletal structures are diffusely demineralized. Right hip joint normally spaced and  aligned. Knee joint normally aligned with advanced degenerative changes. Soft tissues are unremarkable. IMPRESSION: No fracture or dislocation. Electronically Signed   By: Amie Portlandavid  Ormond M.D.   On: 10/25/2018 17:47     EKG: Independently reviewed.  Atrial fibrillation, QTc 460, low voltage, bradycardia, early R wave progression, anteroseptal infarction pattern.  Assessment/Plan Principal Problem:   Fracture of femoral neck, left, closed (HCC) Active Problems:   Essential hypertension   GERD (gastroesophageal reflux disease)   Atrial fibrillation (HCC)   Depression   HLD (hyperlipidemia)   UTI (urinary tract infection)   Fall at home, initial encounter   CKD (chronic kidney disease), stage III (HCC)   Chronic diastolic CHF (congestive heart failure) (HCC)   Fracture of femoral neck, left, closed (HCC):  As evidenced by x-ray. Patient has moderate pain now. No neurovascular compromise. Orthopedic surgeon, Dr. Jena GaussHaddix was consulted.   - will admit to tele bed as inpt - hold Eliuis - Pain control: morphine prn and percocet - When necessary Zofran for nausea - Robaxin for muscle spasm - type and cross - INR/PTT - PT/OT when able to (not ordered now)  HTN:  -Continue home medications: Ziac, metoprolol -Which is also on Lasix -IV hydralazine prn  GERD (gastroesophageal reflux disease): -Protonix  Atrial Fibrillation: CHA2DS2-VASc Score is 4, needs oral anticoagulation. Patient is on Eliquis at home. Heart rate is well controlled. -hold Eliquis for surgery -continue metoprolol   Depression: -Continue home Wellbutrin  HLD (hyperlipidemia): -Pravastatin  UTI (urinary tract  infection): -Rocephin -Follow-up blood culture and urine culture  Fall at home, initial encounter: Patient seems to have had mechanical fall.  CT head and CT of the C-spine is negative for bony fracture. -PT/OT when able to  CKD (chronic kidney disease), stage III (HCC): Close to baseline.  Baseline creatinine 0.9-1.0.  Her creatinine is 1.16, BUN 25. -Follow-up by BMP  Chronic diastolic CHF (congestive heart failure) (HCC): 2D echo on 03/18/2015 showed EF of 55 to 60%.  Patient does not have leg edema.  No shortness of breath.  CHF symptoms compensated. -Continue home Lasix - Check BNP   Perioperative Cardiac Risk: He has multiple comorbidities, including CHF with EF 55%, A fib, HLD, CKD-III and GERD. Currently patient is independent of her ADLs, IADLs. He is on diuretics, metoprolol and statin at home. No recent acute cardiac issues.  Patient does not have chest pain, shortness of breath, palpitation, leg edema.  No signs of acute CHF exacerbation.  EKG has no acute change. At this time point, no further work up is needed. His GUPTA score perioperative myocardial infarction or cardaic arrest is 0.76 %, which moderate risk. Pt has fever which is likely due to UTI. Start IV rocephin. Need f/u Bx and Ux, make sure she has no bacteremia before surgery.    Inpatient status:  # Patient requires inpatient status due to high intensity of service, high risk for further deterioration and high frequency of surveillance required.  I certify that at the point of admission it is my clinical judgment that the patient will require inpatient hospital care spanning beyond 2 midnights from the point of admission.   This patient has multiple chronic comorbidities including A. fib on Eliquis, hyperlipidemia, GERD, CHF, OSA, CKD-3  Now patient has presenting with fall, left hip fracture, UTI  The worrisome physical exam findings include tenderness in left hip and both legs  The initial radiographic and  laboratory data are worrisome because of positive urinalysis for UTI, x-ray showed  left femoral neck fracture..  Current medical needs: please see my assessment and plan  Predictability of an adverse outcome (risk): Patient has multiple comorbidities, now presents with fall, left hip fracture and UTI.  Patient will need surgery.  Given her old age, and multiple comorbidities, patient is at high risk for developing complications from surgery.  Will need to be treated in hospital for at least 2 days.      DVT ppx: SCD Code Status: Full code Family Communication: None at bed side.    Disposition Plan:  Possible SNF Consults called:  Dr. Jena Gauss Admission status:  Inpatient/tele    Date of Service 10/26/2018    Lorretta Harp [facility]   If 7PM-7AM, please contact night-coverage [email or web address] Password [facility] 10/26/2018, 3:27 AM

## 2018-10-25 NOTE — Consult Note (Signed)
Ortho Trauma Note  Reviewed case with Dr. Darl Householder. 78 year old female with afib on Eliquis that has chills and myalgias and had a fall. Unable to ambulate. X-rays showed left femoral neck.  CT scan of left hip ordered to determine if displaced. If non-displaced or valgus impacted will proceed with percutaneous fixation tomorrow. If displaced, will recommend hip hemiarthroplasty likely on Sunday vs Monday. Please make NPO after midnight.  Formal consult in the AM. Please call with questions.  Shona Needles, MD [facility] [phone] (phone) [phone] (office) [email or web address]

## 2018-10-25 NOTE — ED Notes (Signed)
ED TO INPATIENT HANDOFF REPORT  ED Nurse Name and Phone #: [phone] Cindy Gibbs Cindy Gibbs 78 y.o. female Room/Bed: 040C/040C  Code Status   Code Status: Prior  Home/SNF/Other Home Patient oriented to: self, place, time and situation Is this baseline? Yes   Triage Complete: Triage complete  Chief Complaint Fall,Left leg pain   Triage Note Pt BIB EMS from home. Per EMS pt fell on Wednesday EMS was dispatched and pt refused transport. Upon waking this AM pt experiencing pain in her left hip and leg. Pt has been able to walk since fall until today.   Allergies Allergies  Allergen Reactions  . Codeine Nausea Only    Level of Care/Admitting Diagnosis ED Disposition    ED Disposition Condition [facility] Area: [place] [[id number]]  Level of Care: Telemetry Medical [104]  Covid Evaluation: N/A  Diagnosis: Fracture of femoral neck, left, closed [facility]) [[id number]]  Admitting Physician: Ivor Costa [[id number]]  Attending Physician: Ivor Costa [phone]  Estimated length of stay: past midnight tomorrow  Certification:: I certify this patient will need inpatient services for at least 2 midnights  PT Class (Do Not Modify): Inpatient [101]  PT Acc Code (Do Not Modify): Private [1]       B Medical/Surgery History Past Medical History:  Diagnosis Date  . Atrial fibrillation ([place])   . Back pain   . Bilateral knee pain   . CHF (congestive heart failure) ([place])   . Depression   . Essential hypertension   . GERD (gastroesophageal reflux disease)   . HLD (hyperlipidemia)   . Obesity   . OSA (obstructive sleep apnea)    Past Surgical History:  Procedure Laterality Date  . ABDOMINAL HYSTERECTOMY    . CHOLECYSTECTOMY       A IV Location/Drains/Wounds Patient Lines/Drains/Airways Status   Active Line/Drains/Airways    Name:   Placement date:   Placement time:   Site:   Days:   Peripheral IV 10/25/18 Left Antecubital   10/25/18     1641    Antecubital   less than 1   Peripheral IV 10/25/18 Right Hand   10/25/18    1641    Hand   less than 1          Intake/Output Last 24 hours No intake or output data in the 24 hours ending 10/25/18 2111  Labs/Imaging Results for orders placed or performed during the hospital encounter of 10/25/18 (from the past 48 hour(s))  CBC with Differential/Platelet     Status: Abnormal   Collection Time: 10/25/18  3:58 PM  Result Value Ref Range   WBC 10.1 4.0 - 10.5 K/uL   RBC 4.01 3.87 - 5.11 MIL/uL   Hemoglobin 11.0 (L) 12.0 - 15.0 g/dL   HCT 36.4 36.0 - 46.0 %   MCV 90.8 80.0 - 100.0 fL   MCH 27.4 26.0 - 34.0 pg   MCHC 30.2 30.0 - 36.0 g/dL   RDW 15.9 (H) 11.5 - 15.5 %   Platelets 165 150 - 400 K/uL   nRBC 0.0 0.0 - 0.2 %   Neutrophils Relative % 80 %   Neutro Abs 8.1 (H) 1.7 - 7.7 K/uL   Lymphocytes Relative 8 %   Lymphs Abs 0.8 0.7 - 4.0 K/uL   Monocytes Relative 8 %   Monocytes Absolute 0.8 0.1 - 1.0 K/uL   Eosinophils Relative 3 %   Eosinophils Absolute 0.3 0.0 - 0.5 K/uL  Basophils Relative 1 %   Basophils Absolute 0.1 0.0 - 0.1 K/uL   Immature Granulocytes 0 %   Abs Immature Granulocytes 0.04 0.00 - 0.07 K/uL    Comment: Performed at [facility], [address] [place], [place] [address]  Comprehensive metabolic panel     Status: Abnormal   Collection Time: 10/25/18  3:58 PM  Result Value Ref Range   Sodium 137 135 - 145 mmol/L   Potassium 4.5 3.5 - 5.1 mmol/L   Chloride 102 98 - 111 mmol/L   CO2 26 22 - 32 mmol/L   Glucose, Bld 112 (H) 70 - 99 mg/dL   BUN 25 (H) 8 - 23 mg/dL   Creatinine, Ser 1.911.16 (H) 0.44 - 1.00 mg/dL   Calcium 9.3 8.9 - 47.810.3 mg/dL   Total Protein 7.6 6.5 - 8.1 g/dL   Albumin 3.5 3.5 - 5.0 g/dL   AST 77 (H) 15 - 41 U/L   ALT 73 (H) 0 - 44 U/L   Alkaline Phosphatase 131 (H) 38 - 126 U/L   Total Bilirubin 1.5 (H) 0.3 - 1.2 mg/dL   GFR calc non Af Amer 46 (L) >60 mL/min   GFR calc Af Amer 53 (L) >60 mL/min   Anion gap 9 5 - 15     Comment: Performed at [facility], [address] [place], [place] [address]  CK     Status: Abnormal   Collection Time: 10/25/18  3:58 PM  Result Value Ref Range   Total CK 21 (L) 38 - 234 U/L    Comment: Performed at [facility], [address] [place], [place] [address]  SARS Coronavirus 2 (CEPHEID - Performed in [facility] hospital lab), Hosp Order     Status: None   Collection Time: 10/25/18  3:59 PM   Specimen: Nasopharyngeal Swab  Result Value Ref Range   SARS Coronavirus 2 NEGATIVE NEGATIVE    Comment: (NOTE) If result is NEGATIVE SARS-CoV-2 target nucleic acids are NOT DETECTED. The SARS-CoV-2 RNA is generally detectable in upper and lower  respiratory specimens during the acute phase of infection. The lowest  concentration of SARS-CoV-2 viral copies this assay can detect is 250  copies / mL. A negative result does not preclude SARS-CoV-2 infection  and should not be used as the sole basis for treatment or other  patient management decisions.  A negative result may occur with  improper specimen collection / handling, submission of specimen other  than nasopharyngeal swab, presence of viral mutation(s) within the  areas targeted by this assay, and inadequate number of viral copies  (<250 copies / mL). A negative result must be combined with clinical  observations, patient history, and epidemiological information. If result is POSITIVE SARS-CoV-2 target nucleic acids are DETECTED. The SARS-CoV-2 RNA is generally detectable in upper and lower  respiratory specimens dur ing the acute phase of infection.  Positive  results are indicative of active infection with SARS-CoV-2.  Clinical  correlation with patient history and other diagnostic information is  necessary to determine patient infection status.  Positive results do  not rule out bacterial infection or co-infection with other viruses. If result is PRESUMPTIVE POSTIVE SARS-CoV-2 nucleic acids MAY BE  PRESENT.   A presumptive positive result was obtained on the submitted specimen  and confirmed on repeat testing.  While 2019 novel coronavirus  (SARS-CoV-2) nucleic acids may be present in the submitted sample  additional confirmatory testing may be necessary for  epidemiological  and / or clinical management purposes  to differentiate between  SARS-CoV-2 and other Sarbecovirus currently known to infect humans.  If clinically indicated additional testing with an alternate test  methodology [phone]) is advised. The SARS-CoV-2 RNA is generally  detectable in upper and lower respiratory sp ecimens during the acute  phase of infection. The expected result is Negative. Fact Sheet for Patients:  BoilerBrush.com.cy Fact Sheet for Healthcare Providers: https://pope.com/ This test is not yet approved or cleared by the [place] FDA and has been authorized for detection and/or diagnosis of SARS-CoV-2 by FDA under an Emergency Use Authorization (EUA).  This EUA will remain in effect (meaning this test can be used) for the duration of the COVID-19 declaration under Section 564(b)(1) of the Act, 21 U.S.C. section 360bbb-3(b)(1), unless the authorization is terminated or revoked sooner. Performed at [facility], [address] [place], [place] [address]   I-stat troponin, ED     Status: None   Collection Time: 10/25/18  4:26 PM  Result Value Ref Range   Troponin i, poc 0.01 0.00 - 0.08 ng/mL   Comment 3            Comment: Due to the release kinetics of cTnI, a negative result within the first hours of the onset of symptoms does not rule out myocardial infarction with certainty. If myocardial infarction is still suspected, repeat the test at appropriate intervals.   Lactic acid, plasma     Status: None   Collection Time: 10/25/18  4:30 PM  Result Value Ref Range   Lactic Acid, Venous 0.9 0.5 - 1.9 mmol/L    Comment: Performed at  [facility], [address] [place], [place] [address]  Protime-INR     Status: Abnormal   Collection Time: 10/25/18  5:37 PM  Result Value Ref Range   Prothrombin Time 15.3 (H) 11.4 - 15.2 seconds   INR 1.2 0.8 - 1.2    Comment: (NOTE) INR goal varies based on device and disease states. Performed at [facility], [address], [place], [place] [address]   Urinalysis, Routine w reflex microscopic     Status: Abnormal   Collection Time: 10/25/18  7:23 PM  Result Value Ref Range   Color, Urine AMBER (A) YELLOW    Comment: BIOCHEMICALS MAY BE AFFECTED BY COLOR   APPearance HAZY (A) CLEAR   Specific Gravity, Urine 1.019 1.005 - 1.030   pH 5.0 5.0 - 8.0   Glucose, UA NEGATIVE NEGATIVE mg/dL   Hgb urine dipstick SMALL (A) NEGATIVE   Bilirubin Urine NEGATIVE NEGATIVE   Ketones, ur NEGATIVE NEGATIVE mg/dL   Protein, ur 30 (A) NEGATIVE mg/dL   Nitrite NEGATIVE NEGATIVE   Leukocytes,Ua TRACE (A) NEGATIVE   RBC / HPF 0-5 0 - 5 RBC/hpf   WBC, UA 0-5 0 - 5 WBC/hpf   Bacteria, UA MANY (A) NONE SEEN   Squamous Epithelial / LPF 0-5 0 - 5   Hyaline Casts, UA PRESENT     Comment: Performed at [facility], [address], [place], [place] [address]   Dg Chest 1 View  Result Date: 10/25/2018 CLINICAL DATA:  Larey Seat 1-2 days ago. Leg and back pain. Left femoral neck fracture. EXAM: CHEST  1 VIEW COMPARISON:  05/08/2018 FINDINGS: Cardiac silhouette is top-normal in size. No mediastinal or hilar masses. No convincing adenopathy. Clear lungs.  No pleural effusion or pneumothorax. Skeletal structures are demineralized but grossly intact. IMPRESSION:  No acute cardiopulmonary disease. Electronically Signed   By: Amie Portlandavid  Ormond M.D.   On: 10/25/2018 17:50   Dg Pelvis 1-2 Views  Result Date: 10/25/2018 CLINICAL DATA:  Larey SeatFell 1 or 2 days ago.  Right leg pain. EXAM: PELVIS - 1-2 VIEW COMPARISON:  Abdomen and pelvis CT, 08/19/2008 FINDINGS: There is a non comminuted, subcapital fracture of  the left femoral neck, without significant displacement but with valgus angulation. No other fracture.  No bone lesion. Hip joints, SI joints and symphysis pubis are normally aligned. Skeletal structures are diffusely demineralized. They are soft tissue edema adjacent to the fractured left femoral neck. IMPRESSION: 1. Fracture of the left femoral neck as described. No dislocation. No other acute skeletal abnormality. Electronically Signed   By: Amie Portlandavid  Ormond M.D.   On: 10/25/2018 17:44   Dg Tibia/fibula Left  Result Date: 10/25/2018 CLINICAL DATA:  Fall 1-2 days ago.  Left leg pain. EXAM: LEFT TIBIA AND FIBULA - 2 VIEW COMPARISON:  None. FINDINGS: No fracture or bone lesion. Advanced degenerative changes noted of the knee. Knee joint normally aligned. Ankle joint is normally aligned. Skeletal structures are diffusely demineralized. Soft tissues are unremarkable. IMPRESSION: No fracture or dislocation. Electronically Signed   By: Amie Portlandavid  Ormond M.D.   On: 10/25/2018 17:46   Dg Tibia/fibula Right  Result Date: 10/25/2018 CLINICAL DATA:  Fall 1 or 2 days ago.  Right leg pain. EXAM: RIGHT TIBIA AND FIBULA - 2 VIEW COMPARISON:  None. FINDINGS: No fracture.  No bone lesion.  Bones are diffusely demineralized. There are advanced degenerative changes of the knee. Knee joint is normally aligned. Ankle joint is normally aligned. Soft tissues are unremarkable. IMPRESSION: No fracture or dislocation Electronically Signed   By: Amie Portlandavid  Ormond M.D.   On: 10/25/2018 17:45   Ct Head Wo Contrast  Result Date: 10/25/2018 CLINICAL DATA:  Per EMS pt fell on Wednesday EMS was dispatched and pt refused transport. Upon waking this AM pt experiencing pain in her left hip and leg. Pt has been able to walk since fall until today EXAM: CT HEAD WITHOUT CONTRAST CT CERVICAL SPINE WITHOUT CONTRAST TECHNIQUE: Multidetector CT imaging of the head and cervical spine was performed following the standard protocol without intravenous  contrast. Multiplanar CT image reconstructions of the cervical spine were also generated. COMPARISON:  Head CT, 03/16/2015.  Brain MRI, 03/17/2015. FINDINGS: CT HEAD FINDINGS Brain: No evidence of acute infarction, hemorrhage, hydrocephalus, extra-axial collection or mass lesion/mass effect. Mild patchy areas of subcortical and periventricular white matter hypoattenuation consistent with chronic microvascular ischemic change. Vascular: No hyperdense vessel or unexpected calcification. Skull: Normal. Negative for fracture or focal lesion. Sinuses/Orbits: Globes and orbits are unremarkable. Sinuses and mastoid air cells are clear. Other: None. CT CERVICAL SPINE FINDINGS Alignment: Normal. Skull base and vertebrae: No acute fracture. No primary bone lesion or focal pathologic process. Soft tissues and spinal canal: No prevertebral fluid or swelling. No visible canal hematoma. Heterogeneous appearance of the thyroid suggesting bilateral poorly defined nodules. Measure lies esophagus is dilated. No neck masses or enlarged lymph nodes. Carotid vascular calcifications. Disc levels: Mild loss of disc height at C2-C3 and C3-C4. Moderate to marked loss of disc height at C4-C5 through C7-T1 with mild endplate sclerosis and endplate osteophytes. No convincing disc herniation. Upper chest: There is interstitial thickening with mild interval ground-glass opacities at the lung apices, right greater than left. This may be chronic. Consider congestive heart failure if there are consistent clinical findings. Infection is not excluded. Other:  None. IMPRESSION: HEAD CT 1. No acute intracranial abnormalities. 2. No skull fracture. CERVICAL CT 1. No fracture or acute finding. 2. Degenerative changes as detailed. 3. Interstitial thickening and intervening areas of ground-glass opacity at the lung apices, right greater than left. This is consistent with interstitial airspace edema from congestive heart failure in the proper clinical  setting. Infection or inflammation could have this appearance. Findings could be chronic. Electronically Signed   By: Amie Portland M.D.   On: 10/25/2018 18:37   Ct Cervical Spine Wo Contrast  Result Date: 10/25/2018 CLINICAL DATA:  Per EMS pt fell on Wednesday EMS was dispatched and pt refused transport. Upon waking this AM pt experiencing pain in her left hip and leg. Pt has been able to walk since fall until today EXAM: CT HEAD WITHOUT CONTRAST CT CERVICAL SPINE WITHOUT CONTRAST TECHNIQUE: Multidetector CT imaging of the head and cervical spine was performed following the standard protocol without intravenous contrast. Multiplanar CT image reconstructions of the cervical spine were also generated. COMPARISON:  Head CT, 03/16/2015.  Brain MRI, 03/17/2015. FINDINGS: CT HEAD FINDINGS Brain: No evidence of acute infarction, hemorrhage, hydrocephalus, extra-axial collection or mass lesion/mass effect. Mild patchy areas of subcortical and periventricular white matter hypoattenuation consistent with chronic microvascular ischemic change. Vascular: No hyperdense vessel or unexpected calcification. Skull: Normal. Negative for fracture or focal lesion. Sinuses/Orbits: Globes and orbits are unremarkable. Sinuses and mastoid air cells are clear. Other: None. CT CERVICAL SPINE FINDINGS Alignment: Normal. Skull base and vertebrae: No acute fracture. No primary bone lesion or focal pathologic process. Soft tissues and spinal canal: No prevertebral fluid or swelling. No visible canal hematoma. Heterogeneous appearance of the thyroid suggesting bilateral poorly defined nodules. Measure lies esophagus is dilated. No neck masses or enlarged lymph nodes. Carotid vascular calcifications. Disc levels: Mild loss of disc height at C2-C3 and C3-C4. Moderate to marked loss of disc height at C4-C5 through C7-T1 with mild endplate sclerosis and endplate osteophytes. No convincing disc herniation. Upper chest: There is interstitial  thickening with mild interval ground-glass opacities at the lung apices, right greater than left. This may be chronic. Consider congestive heart failure if there are consistent clinical findings. Infection is not excluded. Other: None. IMPRESSION: HEAD CT 1. No acute intracranial abnormalities. 2. No skull fracture. CERVICAL CT 1. No fracture or acute finding. 2. Degenerative changes as detailed. 3. Interstitial thickening and intervening areas of ground-glass opacity at the lung apices, right greater than left. This is consistent with interstitial airspace edema from congestive heart failure in the proper clinical setting. Infection or inflammation could have this appearance. Findings could be chronic. Electronically Signed   By: Amie Portland M.D.   On: 10/25/2018 18:37   Ct Hip Left Wo Contrast  Result Date: 10/25/2018 CLINICAL DATA:  Left hip fracture. EXAM: CT OF THE LEFT HIP WITHOUT CONTRAST TECHNIQUE: Multidetector CT imaging of the left hip was performed according to the standard protocol. Multiplanar CT image reconstructions were also generated. COMPARISON:  Radiographs from 10/25/2018 FINDINGS: Bones/Joint/Cartilage Subcapital left femoral neck fracture noted with mild lateral impaction and mild lateral cortical discontinuity. The adjacent portion of the pelvis appears normal. Little if any hip joint effusion. Ligaments Suboptimally assessed by CT. Muscles and Tendons Atrophic gluteus minimus muscle, not uncommon in this age group. Soft tissues Sigmoid colon diverticulosis. Left common femoral artery atherosclerotic calcification. IMPRESSION: 1. Subcapital left femoral neck fracture noted with mild lateral impaction. 2. Sigmoid colon diverticulosis. 3. Left common femoral artery atherosclerotic calcification. Electronically  Signed   By: Gaylyn RongWalter  Liebkemann M.D.   On: 10/25/2018 21:00   Dg Femur Min 2 Views Left  Result Date: 10/25/2018 CLINICAL DATA:  Fall leg pain.  1-2 days ago.  Left EXAM: LEFT  FEMUR 2 VIEWS COMPARISON:  None. FINDINGS: There is a subcapital fracture of the left femoral neck. No fracture comminution or significant displacement. There is valgus angulation. No other fractures. No bone lesions. Skeletal structures are demineralized. Hip and knee joints are normally aligned. There are advanced degenerative changes of the left knee joint. Small knee joint effusion. IMPRESSION: 1. Nondisplaced, non comminuted subcapital fracture of the left femoral neck with valgus angulation. 2. No other fractures.  No dislocation Electronically Signed   By: Amie Portlandavid  Ormond M.D.   On: 10/25/2018 17:49   Dg Femur Min 2 Views Right  Result Date: 10/25/2018 CLINICAL DATA:  Larey SeatFell 1-2 days ago.  Right leg pain. EXAM: RIGHT FEMUR 2 VIEWS COMPARISON:  None. FINDINGS: No fracture. No bone lesion. The skeletal structures are diffusely demineralized. Right hip joint normally spaced and aligned. Knee joint normally aligned with advanced degenerative changes. Soft tissues are unremarkable. IMPRESSION: No fracture or dislocation. Electronically Signed   By: Amie Portlandavid  Ormond M.D.   On: 10/25/2018 17:47    Pending Labs Unresulted Labs (From admission, onward)    Start     Ordered   10/25/18 2106  Brain natriuretic peptide  ONCE - STAT,   STAT     10/25/18 2105   10/25/18 1558  Lactic acid, plasma  Now then every 2 hours,   STAT     10/25/18 1557   10/25/18 1558  Blood culture (routine x 2)  BLOOD CULTURE X 2,   STAT     10/25/18 1557   10/25/18 1558  Urine culture  ONCE - STAT,   STAT     10/25/18 1557   Signed and Held  APTT  Once,   R     Signed and Held   Signed and Held  CBC  Tomorrow morning,   R     Signed and Held   Signed and Held  Basic metabolic panel  Tomorrow morning,   R     Signed and Held   Signed and Held  Type and screen [facility]  Once,   R    Comments: [facility]    Signed and Held          Vitals/Pain Today's Vitals   10/25/18 1945 10/25/18  2000 10/25/18 2015 10/25/18 2030  BP: (!) 119/34 (!) 111/52 122/79 (!) 103/43  Pulse:  74 77 78  Resp: (!) 23 18 18 17   Temp:      TempSrc:      SpO2:  96% 95% 95%  Weight:      Height:      PainSc:        Isolation Precautions No active isolations  Medications Medications  cefTRIAXone (ROCEPHIN) 1 g in sodium chloride 0.9 % 100 mL IVPB (0 g Intravenous Stopped 10/25/18 2106)  acetaminophen (TYLENOL) tablet 1,000 mg (1,000 mg Oral Given 10/25/18 1632)  sodium chloride 0.9 % bolus 1,000 mL (0 mLs Intravenous Stopped 10/25/18 2032)  morphine 4 MG/ML injection 4 mg (4 mg Intravenous Given 10/25/18 2055)    Mobility walks with device High fall risk   Focused Assessments    R Recommendations: See Admitting Provider Note  Report given to:   Additional Notes:

## 2018-10-25 NOTE — ED Notes (Signed)
Patient transported to CT 

## 2018-10-25 NOTE — ED Notes (Signed)
Handoff report given to Bobby, RN 

## 2018-10-25 NOTE — ED Triage Notes (Signed)
Pt BIB EMS from home. Per EMS pt fell on Wednesday EMS was dispatched and pt refused transport. Upon waking this AM pt experiencing pain in her left hip and leg. Pt has been able to walk since fall until today.

## 2018-10-25 NOTE — ED Notes (Signed)
Patient transported to CT scan . 

## 2018-10-25 NOTE — ED Provider Notes (Signed)
[facility] EMERGENCY DEPARTMENT Provider Note   CSN: [id number] Arrival date & time: 10/25/18  1523    History   Chief Complaint Chief Complaint  Patient presents with   Leg Injury    HPI Cindy Gibbs is a 78 y.o. female hx of afib on eliquis, here with chills, myalgias, fall.  Patient states that she is from home and has been very weak for the last several days.  She is achy all over especially in her hip and legs.  She also has subjective chills as well.  She states that he had fallen yesterday and hit her hip and her legs.  She thought she was too weak to walk today came here for evaluation.  Denies any cough or shortness of breath.      The history is provided by the patient.    Past Medical History:  Diagnosis Date   Atrial fibrillation (HCC)    Back pain    Bilateral knee pain    CHF (congestive heart failure) (HCC)    Depression    Essential hypertension    GERD (gastroesophageal reflux disease)    HLD (hyperlipidemia)    Obesity    OSA (obstructive sleep apnea)     Patient Active Problem List   Diagnosis Date Noted   Fall at home, initial encounter 10/25/2018   Fracture of femoral neck, left, closed (HCC) 10/25/2018   Fever 10/25/2018   CKD (chronic kidney disease), stage III (HCC) 10/25/2018   Chronic diastolic CHF (congestive heart failure) (HCC) 10/25/2018   UTI (urinary tract infection) 03/18/2015   Acute encephalopathy 03/17/2015   Right sided weakness 03/17/2015   HLD (hyperlipidemia) 03/17/2015   Essential hypertension    GERD (gastroesophageal reflux disease)    Back pain    Bilateral knee pain    OSA (obstructive sleep apnea)    Obesity    Atrial fibrillation (HCC)    Depression     Past Surgical History:  Procedure Laterality Date   ABDOMINAL HYSTERECTOMY     CHOLECYSTECTOMY       OB History   No obstetric history on file.      Home Medications    Prior to Admission  medications   Medication Sig Start Date End Date Taking? Authorizing Provider  acetaminophen (TYLENOL) 325 MG tablet Take 325 mg by mouth every 6 (six) hours as needed for mild pain or headache.  11/15/16  Yes [provider]  albuterol (PROAIR HFA) 108 (90 Base) MCG/ACT inhaler Inhale 2 puffs into the lungs every 6 (six) hours as needed for wheezing or shortness of breath.   Yes [provider]  albuterol (PROVENTIL) (2.5 MG/3ML) 0.083% nebulizer solution Take 2.5 mg by nebulization every 6 (six) hours as needed for wheezing or shortness of breath.  06/21/16  Yes [provider]  apixaban (ELIQUIS) 5 MG TABS tablet Take 5 mg by mouth 2 (two) times daily.   Yes [provider]  bisoprolol-hydrochlorothiazide (ZIAC) 5-6.25 MG tablet Take 1 tablet by mouth daily. 03/09/15  Yes [provider]  buPROPion (WELLBUTRIN XL) 150 MG 24 hr tablet Take 300 mg by mouth every morning.   Yes [provider]  diphenhydrAMINE (BENADRYL) 25 mg capsule Take 25 mg by mouth every 6 (six) hours as needed for allergies (or nasal drainage).    Yes [provider]  furosemide (LASIX) 20 MG tablet Take 20 mg by mouth daily.   Yes [provider]  lidocaine (XYLOCAINE) 5 %  ointment Apply 1 application topically as needed for mild pain (to affected site).    Yes [provider]  lovastatin (MEVACOR) 40 MG tablet Take 40 mg by mouth at bedtime.  02/15/15  Yes [provider]  metoprolol succinate (TOPROL-XL) 50 MG 24 hr tablet Take 50 mg by mouth 2 (two) times a day. Take with or immediately following a meal.   Yes [provider]  nitroGLYCERIN (NITROSTAT) 0.4 MG SL tablet Place 0.4 mg under the tongue every 5 (five) minutes x 3 doses as needed (CALL 9-1-1, if no relief).  07/01/14  Yes [provider]  omeprazole (PRILOSEC) 20 MG capsule Take 20 mg by mouth 2 (two) times a day.  02/15/15  Yes [provider]  Oxycodone  HCl 20 MG TABS Take 20 mg by mouth every 4 (four) hours as needed (for pain).    Yes [provider]  pregabalin (LYRICA) 75 MG capsule Take 75 mg by mouth 2 (two) times daily.   Yes [provider]  sertraline (ZOLOFT) 100 MG tablet Take 200 mg by mouth at bedtime.  03/09/15  Yes [provider]  traMADol (ULTRAM) 50 MG tablet Take 50 mg by mouth daily as needed (for pain).   Yes [provider]  cyanocobalamin (,VITAMIN B-12,) 1000 MCG/ML injection Inject 1,000 mcg into the muscle every 30 (thirty) days. 11/04/13   [provider]    Family History Family History  Problem Relation Age of Onset   Lung cancer Mother    Heart disease Father    Cirrhosis Brother     Social History Social History   Tobacco Use   Smoking status: Never Smoker   Smokeless tobacco: Never Used  Substance Use Topics   Alcohol use: Not on file   Drug use: Not on file     Allergies   Codeine   Review of Systems Review of Systems  Constitutional: Positive for chills.  Musculoskeletal:       Bilateral leg pain   All other systems reviewed and are negative.    Physical Exam Updated Vital Signs BP 102/67 (BP Location: Right Arm)    Pulse 63    Temp 97.9 F (36.6 C) (Oral)    Resp 16    Ht  (1.626 m)    Wt 108.4 kg    SpO2 93%    BMI 41.02 kg/m   Physical Exam Vitals signs and nursing note reviewed.  HENT:     Head: Normocephalic.     Comments: No obvious scalp hematoma     Nose: Nose normal.     Mouth/Throat:     Mouth: Mucous membranes are moist.  Eyes:     Extraocular Movements: Extraocular movements intact.     Pupils: Pupils are equal, round, and reactive to light.  Neck:     Musculoskeletal: Normal range of motion.  Cardiovascular:     Rate and Rhythm: Normal rate and regular rhythm.     Pulses: Normal pulses.     Heart sounds: Normal heart sounds.  Pulmonary:     Effort: Pulmonary effort is normal.     Breath sounds:  Normal breath sounds.  Abdominal:     Palpations: Abdomen is soft.  Musculoskeletal:     Comments: No midline spinal tenderness. Mild diffuse tenderness bilateral lower extremities. No obvious deformity. Able to range the hips and knees and ankle. No saddle anesthesia   Skin:    General: Skin is warm.  Comments: No sacral decub ulcers   Neurological:     General: No focal deficit present.     Mental Status: She is alert.  Psychiatric:        Mood and Affect: Mood normal.        Behavior: Behavior normal.      ED Treatments / Results  Labs (all labs ordered are listed, but only abnormal results are displayed) Labs Reviewed  CBC WITH DIFFERENTIAL/PLATELET - Abnormal; Notable for the following components:      Result Value   Hemoglobin 11.0 (*)    RDW 15.9 (*)    Neutro Abs 8.1 (*)    All other components within normal limits  COMPREHENSIVE METABOLIC PANEL - Abnormal; Notable for the following components:   Glucose, Bld 112 (*)    BUN 25 (*)    Creatinine, Ser 1.16 (*)    AST 77 (*)    ALT 73 (*)    Alkaline Phosphatase 131 (*)    Total Bilirubin 1.5 (*)    GFR calc non Af Amer 46 (*)    GFR calc Af Amer 53 (*)    All other components within normal limits  URINALYSIS, ROUTINE W REFLEX MICROSCOPIC - Abnormal; Notable for the following components:   Color, Urine AMBER (*)    APPearance HAZY (*)    Hgb urine dipstick SMALL (*)    Protein, ur 30 (*)    Leukocytes,Ua TRACE (*)    Bacteria, UA MANY (*)    All other components within normal limits  CK - Abnormal; Notable for the following components:   Total CK 21 (*)    All other components within normal limits  PROTIME-INR - Abnormal; Notable for the following components:   Prothrombin Time 15.3 (*)    All other components within normal limits  BRAIN NATRIURETIC PEPTIDE - Abnormal; Notable for the following components:   B Natriuretic Peptide 471.2 (*)    All other components within normal limits  SARS CORONAVIRUS 2  (HOSPITAL ORDER, PERFORMED IN [facility])  CULTURE, BLOOD (ROUTINE X 2)  CULTURE, BLOOD (ROUTINE X 2)  URINE CULTURE  LACTIC ACID, PLASMA  LACTIC ACID, PLASMA  APTT  CBC  BASIC METABOLIC PANEL  I-STAT TROPONIN, ED  TYPE AND SCREEN  ABO/RH    EKG EKG Interpretation  Date/Time:  Friday October 25 2018 16:08:02 EDT Ventricular Rate:  80 PR Interval:    QRS Duration: 84 QT Interval:  398 QTC Calculation: 460 R Axis:   66 Text Interpretation:  Atrial fibrillation Ventricular premature complex Nonspecific T abnormalities, lateral leads No previous ECGs available Confirmed by Richardean CanalYao, Nirvana Blanchett H ([id number]) on 10/25/2018 4:17:14 PM   Radiology Dg Chest 1 View  Result Date: 10/25/2018 CLINICAL DATA:  Larey SeatFell 1-2 days ago. Leg and back pain. Left femoral neck fracture. EXAM: CHEST  1 VIEW COMPARISON:  05/08/2018 FINDINGS: Cardiac silhouette is top-normal in size. No mediastinal or hilar masses. No convincing adenopathy. Clear lungs.  No pleural effusion or pneumothorax. Skeletal structures are demineralized but grossly intact. IMPRESSION: No acute cardiopulmonary disease. Electronically Signed   By: Amie Portlandavid  Ormond M.D.   On: 10/25/2018 17:50   Dg Pelvis 1-2 Views  Result Date: 10/25/2018 CLINICAL DATA:  Larey SeatFell 1 or 2 days ago.  Right leg pain. EXAM: PELVIS - 1-2 VIEW COMPARISON:  Abdomen and pelvis CT, 08/19/2008 FINDINGS: There is a non comminuted, subcapital fracture of the left femoral neck, without significant displacement but with valgus angulation. No other  fracture.  No bone lesion. Hip joints, SI joints and symphysis pubis are normally aligned. Skeletal structures are diffusely demineralized. They are soft tissue edema adjacent to the fractured left femoral neck. IMPRESSION: 1. Fracture of the left femoral neck as described. No dislocation. No other acute skeletal abnormality. Electronically Signed   By: Amie Portland M.D.   On: 10/25/2018 17:44   Dg Tibia/fibula Left  Result Date:  10/25/2018 CLINICAL DATA:  Fall 1-2 days ago.  Left leg pain. EXAM: LEFT TIBIA AND FIBULA - 2 VIEW COMPARISON:  None. FINDINGS: No fracture or bone lesion. Advanced degenerative changes noted of the knee. Knee joint normally aligned. Ankle joint is normally aligned. Skeletal structures are diffusely demineralized. Soft tissues are unremarkable. IMPRESSION: No fracture or dislocation. Electronically Signed   By: Amie Portland M.D.   On: 10/25/2018 17:46   Dg Tibia/fibula Right  Result Date: 10/25/2018 CLINICAL DATA:  Fall 1 or 2 days ago.  Right leg pain. EXAM: RIGHT TIBIA AND FIBULA - 2 VIEW COMPARISON:  None. FINDINGS: No fracture.  No bone lesion.  Bones are diffusely demineralized. There are advanced degenerative changes of the knee. Knee joint is normally aligned. Ankle joint is normally aligned. Soft tissues are unremarkable. IMPRESSION: No fracture or dislocation Electronically Signed   By: Amie Portland M.D.   On: 10/25/2018 17:45   Ct Head Wo Contrast  Result Date: 10/25/2018 CLINICAL DATA:  Per EMS pt fell on Wednesday EMS was dispatched and pt refused transport. Upon waking this AM pt experiencing pain in her left hip and leg. Pt has been able to walk since fall until today EXAM: CT HEAD WITHOUT CONTRAST CT CERVICAL SPINE WITHOUT CONTRAST TECHNIQUE: Multidetector CT imaging of the head and cervical spine was performed following the standard protocol without intravenous contrast. Multiplanar CT image reconstructions of the cervical spine were also generated. COMPARISON:  Head CT, 03/16/2015.  Brain MRI, 03/17/2015. FINDINGS: CT HEAD FINDINGS Brain: No evidence of acute infarction, hemorrhage, hydrocephalus, extra-axial collection or mass lesion/mass effect. Mild patchy areas of subcortical and periventricular white matter hypoattenuation consistent with chronic microvascular ischemic change. Vascular: No hyperdense vessel or unexpected calcification. Skull: Normal. Negative for fracture or focal  lesion. Sinuses/Orbits: Globes and orbits are unremarkable. Sinuses and mastoid air cells are clear. Other: None. CT CERVICAL SPINE FINDINGS Alignment: Normal. Skull base and vertebrae: No acute fracture. No primary bone lesion or focal pathologic process. Soft tissues and spinal canal: No prevertebral fluid or swelling. No visible canal hematoma. Heterogeneous appearance of the thyroid suggesting bilateral poorly defined nodules. Measure lies esophagus is dilated. No neck masses or enlarged lymph nodes. Carotid vascular calcifications. Disc levels: Mild loss of disc height at C2-C3 and C3-C4. Moderate to marked loss of disc height at C4-C5 through C7-T1 with mild endplate sclerosis and endplate osteophytes. No convincing disc herniation. Upper chest: There is interstitial thickening with mild interval ground-glass opacities at the lung apices, right greater than left. This may be chronic. Consider congestive heart failure if there are consistent clinical findings. Infection is not excluded. Other: None. IMPRESSION: HEAD CT 1. No acute intracranial abnormalities. 2. No skull fracture. CERVICAL CT 1. No fracture or acute finding. 2. Degenerative changes as detailed. 3. Interstitial thickening and intervening areas of ground-glass opacity at the lung apices, right greater than left. This is consistent with interstitial airspace edema from congestive heart failure in the proper clinical setting. Infection or inflammation could have this appearance. Findings could be chronic. Electronically Signed   By:  Amie Portlandavid  Ormond M.D.   On: 10/25/2018 18:37   Ct Cervical Spine Wo Contrast  Result Date: 10/25/2018 CLINICAL DATA:  Per EMS pt fell on Wednesday EMS was dispatched and pt refused transport. Upon waking this AM pt experiencing pain in her left hip and leg. Pt has been able to walk since fall until today EXAM: CT HEAD WITHOUT CONTRAST CT CERVICAL SPINE WITHOUT CONTRAST TECHNIQUE: Multidetector CT imaging of the head and  cervical spine was performed following the standard protocol without intravenous contrast. Multiplanar CT image reconstructions of the cervical spine were also generated. COMPARISON:  Head CT, 03/16/2015.  Brain MRI, 03/17/2015. FINDINGS: CT HEAD FINDINGS Brain: No evidence of acute infarction, hemorrhage, hydrocephalus, extra-axial collection or mass lesion/mass effect. Mild patchy areas of subcortical and periventricular white matter hypoattenuation consistent with chronic microvascular ischemic change. Vascular: No hyperdense vessel or unexpected calcification. Skull: Normal. Negative for fracture or focal lesion. Sinuses/Orbits: Globes and orbits are unremarkable. Sinuses and mastoid air cells are clear. Other: None. CT CERVICAL SPINE FINDINGS Alignment: Normal. Skull base and vertebrae: No acute fracture. No primary bone lesion or focal pathologic process. Soft tissues and spinal canal: No prevertebral fluid or swelling. No visible canal hematoma. Heterogeneous appearance of the thyroid suggesting bilateral poorly defined nodules. Measure lies esophagus is dilated. No neck masses or enlarged lymph nodes. Carotid vascular calcifications. Disc levels: Mild loss of disc height at C2-C3 and C3-C4. Moderate to marked loss of disc height at C4-C5 through C7-T1 with mild endplate sclerosis and endplate osteophytes. No convincing disc herniation. Upper chest: There is interstitial thickening with mild interval ground-glass opacities at the lung apices, right greater than left. This may be chronic. Consider congestive heart failure if there are consistent clinical findings. Infection is not excluded. Other: None. IMPRESSION: HEAD CT 1. No acute intracranial abnormalities. 2. No skull fracture. CERVICAL CT 1. No fracture or acute finding. 2. Degenerative changes as detailed. 3. Interstitial thickening and intervening areas of ground-glass opacity at the lung apices, right greater than left. This is consistent with  interstitial airspace edema from congestive heart failure in the proper clinical setting. Infection or inflammation could have this appearance. Findings could be chronic. Electronically Signed   By: Amie Portlandavid  Ormond M.D.   On: 10/25/2018 18:37   Ct Hip Left Wo Contrast  Result Date: 10/25/2018 CLINICAL DATA:  Left hip fracture. EXAM: CT OF THE LEFT HIP WITHOUT CONTRAST TECHNIQUE: Multidetector CT imaging of the left hip was performed according to the standard protocol. Multiplanar CT image reconstructions were also generated. COMPARISON:  Radiographs from 10/25/2018 FINDINGS: Bones/Joint/Cartilage Subcapital left femoral neck fracture noted with mild lateral impaction and mild lateral cortical discontinuity. The adjacent portion of the pelvis appears normal. Little if any hip joint effusion. Ligaments Suboptimally assessed by CT. Muscles and Tendons Atrophic gluteus minimus muscle, not uncommon in this age group. Soft tissues Sigmoid colon diverticulosis. Left common femoral artery atherosclerotic calcification. IMPRESSION: 1. Subcapital left femoral neck fracture noted with mild lateral impaction. 2. Sigmoid colon diverticulosis. 3. Left common femoral artery atherosclerotic calcification. Electronically Signed   By: Gaylyn RongWalter  Liebkemann M.D.   On: 10/25/2018 21:00   Dg Femur Min 2 Views Left  Result Date: 10/25/2018 CLINICAL DATA:  Fall leg pain.  1-2 days ago.  Left EXAM: LEFT FEMUR 2 VIEWS COMPARISON:  None. FINDINGS: There is a subcapital fracture of the left femoral neck. No fracture comminution or significant displacement. There is valgus angulation. No other fractures. No bone lesions. Skeletal structures are  demineralized. Hip and knee joints are normally aligned. There are advanced degenerative changes of the left knee joint. Small knee joint effusion. IMPRESSION: 1. Nondisplaced, non comminuted subcapital fracture of the left femoral neck with valgus angulation. 2. No other fractures.  No dislocation  Electronically Signed   By: Lajean Manes M.D.   On: 10/25/2018 17:49   Dg Femur Min 2 Views Right  Result Date: 10/25/2018 CLINICAL DATA:  [place] 1-2 days ago.  Right leg pain. EXAM: RIGHT FEMUR 2 VIEWS COMPARISON:  None. FINDINGS: No fracture. No bone lesion. The skeletal structures are diffusely demineralized. Right hip joint normally spaced and aligned. Knee joint normally aligned with advanced degenerative changes. Soft tissues are unremarkable. IMPRESSION: No fracture or dislocation. Electronically Signed   By: Lajean Manes M.D.   On: 10/25/2018 17:47    Procedures Procedures (including critical care time)  Medications Ordered in ED Medications  cefTRIAXone (ROCEPHIN) 1 g in sodium chloride 0.9 % 100 mL IVPB (0 g Intravenous Stopped 10/25/18 2106)  acetaminophen (TYLENOL) tablet 325 mg (has no administration in time range)  bisoprolol-hydrochlorothiazide (ZIAC) 5-6.25 MG per tablet 1 tablet (has no administration in time range)  furosemide (LASIX) tablet 20 mg (has no administration in time range)  pravastatin (PRAVACHOL) tablet 40 mg (has no administration in time range)  metoprolol succinate (TOPROL-XL) 24 hr tablet 50 mg (has no administration in time range)  nitroGLYCERIN (NITROSTAT) SL tablet 0.4 mg (has no administration in time range)  buPROPion (WELLBUTRIN XL) 24 hr tablet 300 mg (has no administration in time range)  sertraline (ZOLOFT) tablet 200 mg (200 mg Oral Given 10/25/18 2318)  pantoprazole (PROTONIX) EC tablet 40 mg (has no administration in time range)  pregabalin (LYRICA) capsule 75 mg (75 mg Oral Given 10/25/18 2318)  albuterol (PROVENTIL) (2.5 MG/3ML) 0.083% nebulizer solution 2.5 mg (has no administration in time range)  diphenhydrAMINE (BENADRYL) capsule 25 mg (has no administration in time range)  morphine 2 MG/ML injection 1 mg (has no administration in time range)  oxyCODONE-acetaminophen (PERCOCET/ROXICET) 5-325 MG per tablet 1 tablet (has no administration in  time range)  methocarbamol (ROBAXIN) tablet 500 mg (has no administration in time range)  senna-docusate (Senokot-S) tablet 1 tablet (has no administration in time range)  ondansetron (ZOFRAN) injection 4 mg (has no administration in time range)  hydrALAZINE (APRESOLINE) injection 5 mg (has no administration in time range)  acetaminophen (TYLENOL) tablet 1,000 mg (1,000 mg Oral Given 10/25/18 1632)  sodium chloride 0.9 % bolus 1,000 mL (0 mLs Intravenous Stopped 10/25/18 2032)  morphine 4 MG/ML injection 4 mg (4 mg Intravenous Given 10/25/18 2055)     Initial Impression / Assessment and Plan / ED Course  I have reviewed the triage vital signs and the nursing notes.  Pertinent labs & imaging results that were available during my care of the patient were reviewed by me and considered in my medical decision making (see chart for details).       Cindy Gibbs is a 78 y.o. female here with fever, fall.  Patient is on blood thinner and had a potential fall yesterday.  She appears sore all over in the bilateral lower extremities.  I think her weakness and soreness is likely from her fever.  We will do a sepsis work-up to consider the source of the fever which includes urinary tract infection, pneumonia, COVID.  She has no meningeal signs on my exam. Will get CT head, labs, CXR, COVID, UA. Will get xrays of lower  extremity.   7 pm Xray showed L femoral neck fracture. Talked to Dr. Jena GaussHaddix. Patient is on eliquis so consider NPO after midnight and possible surgery in AM. She had a sepsis workup and WBC is normal, lactate nl. CXR clear, COVID negative. UA ? UTI. Given rocephin. Hospitalist to admit.    Final Clinical Impressions(s) / ED Diagnoses   Final diagnoses:  Closed fracture of neck of left femur, initial encounter [facility](HCC)    ED Discharge Orders    None       Charlynne PanderYao, Eldana Isip Hsienta, MD 10/25/18 2338

## 2018-10-26 ENCOUNTER — Inpatient Hospital Stay (HOSPITAL_COMMUNITY): Payer: Medicare Other

## 2018-10-26 ENCOUNTER — Inpatient Hospital Stay (HOSPITAL_COMMUNITY): Payer: Medicare Other | Admitting: Critical Care Medicine

## 2018-10-26 ENCOUNTER — Encounter (HOSPITAL_COMMUNITY)
Admission: EM | Disposition: A | Discharge status: Skilled Nursing Facility | Payer: Self-pay | Source: Home / Self Care | Attending: Internal Medicine

## 2018-10-26 ENCOUNTER — Encounter (HOSPITAL_COMMUNITY): Payer: Self-pay | Admitting: Critical Care Medicine

## 2018-10-26 DIAGNOSIS — I480 Paroxysmal atrial fibrillation: Secondary | ICD-10-CM

## 2018-10-26 DIAGNOSIS — F325 Major depressive disorder, single episode, in full remission: Secondary | ICD-10-CM

## 2018-10-26 HISTORY — PX: HIP PINNING,CANNULATED: SHX1758

## 2018-10-26 LAB — BASIC METABOLIC PANEL
Anion gap: 10 (ref 5–15)
BUN: 22 mg/dL (ref 8–23)
CO2: 23 mmol/L (ref 22–32)
Calcium: 8.8 mg/dL — ABNORMAL LOW (ref 8.9–10.3)
Chloride: 104 mmol/L (ref 98–111)
Creatinine, Ser: 1.16 mg/dL — ABNORMAL HIGH (ref 0.44–1.00)
GFR calc Af Amer: 53 mL/min — ABNORMAL LOW (ref >60)
GFR calc non Af Amer: 46 mL/min — ABNORMAL LOW (ref >60)
Glucose, Bld: 93 mg/dL (ref 70–99)
Potassium: 4.1 mmol/L (ref 3.5–5.1)
Sodium: 137 mmol/L (ref 135–145)

## 2018-10-26 LAB — CBC
HCT: 33.3 % — ABNORMAL LOW (ref 36.0–46.0)
Hemoglobin: 10.1 g/dL — ABNORMAL LOW (ref 12.0–15.0)
MCH: 27.3 pg (ref 26.0–34.0)
MCHC: 30.3 g/dL (ref 30.0–36.0)
MCV: 90 fL (ref 80.0–100.0)
Platelets: 144 10*3/uL — ABNORMAL LOW (ref 150–400)
RBC: 3.7 MIL/uL — ABNORMAL LOW (ref 3.87–5.11)
RDW: 15.9 % — ABNORMAL HIGH (ref 11.5–15.5)
WBC: 8.7 10*3/uL (ref 4.0–10.5)
nRBC: 0 % (ref 0.0–0.2)

## 2018-10-26 LAB — ABO/RH: ABO/RH(D): A POS

## 2018-10-26 LAB — SURGICAL PCR SCREEN
MRSA, PCR: NEGATIVE
Staphylococcus aureus: POSITIVE — AB

## 2018-10-26 SURGERY — FIXATION, FEMUR, NECK, PERCUTANEOUS, USING SCREW
Anesthesia: General | Laterality: Left

## 2018-10-26 MED ORDER — CEFAZOLIN SODIUM-DEXTROSE 2-3 GM-%(50ML) IV SOLR
INTRAVENOUS | Status: DC | PRN
  Administered 2018-10-26: 2 g via INTRAVENOUS

## 2018-10-26 MED ORDER — ADULT MULTIVITAMIN W/MINERALS CH
1.0000 | ORAL_TABLET | Freq: Every day | ORAL | Status: DC
  Administered 2018-10-27 – 2018-10-30 (×4): 1 via ORAL
  Filled 2018-10-26 (×4): qty 1

## 2018-10-26 MED ORDER — DEXAMETHASONE SODIUM PHOSPHATE 10 MG/ML IJ SOLN
INTRAMUSCULAR | Status: DC | PRN
  Administered 2018-10-26: 10 mg via INTRAVENOUS

## 2018-10-26 MED ORDER — FENTANYL CITRATE (PF) 250 MCG/5ML IJ SOLN
INTRAMUSCULAR | Status: AC
  Filled 2018-10-26: qty 5

## 2018-10-26 MED ORDER — MEPERIDINE HCL 25 MG/ML IJ SOLN: 6.2500 mg | INTRAMUSCULAR | Status: DC | PRN

## 2018-10-26 MED ORDER — FENTANYL CITRATE (PF) 100 MCG/2ML IJ SOLN
25.0000 ug | Freq: Once | INTRAMUSCULAR | Status: AC
  Administered 2018-10-26: 25 ug via INTRAVENOUS

## 2018-10-26 MED ORDER — ONDANSETRON HCL 4 MG/2ML IJ SOLN
INTRAMUSCULAR | Status: DC | PRN
  Administered 2018-10-26: 4 mg via INTRAVENOUS

## 2018-10-26 MED ORDER — MIDAZOLAM HCL 2 MG/2ML IJ SOLN: 0.5000 mg | Freq: Once | INTRAMUSCULAR | Status: DC | PRN

## 2018-10-26 MED ORDER — PROMETHAZINE HCL 25 MG/ML IJ SOLN: 6.2500 mg | INTRAMUSCULAR | Status: DC | PRN

## 2018-10-26 MED ORDER — FENTANYL CITRATE (PF) 100 MCG/2ML IJ SOLN
25.0000 ug | INTRAMUSCULAR | Status: DC | PRN
  Administered 2018-10-26: 50 ug via INTRAVENOUS

## 2018-10-26 MED ORDER — LIDOCAINE 2% (20 MG/ML) 5 ML SYRINGE
INTRAMUSCULAR | Status: AC
  Filled 2018-10-26: qty 5

## 2018-10-26 MED ORDER — ENSURE MAX PROTEIN PO LIQD
11.0000 [oz_av] | Freq: Two times a day (BID) | ORAL | Status: DC
  Administered 2018-10-26 – 2018-10-30 (×8): 11 [oz_av] via ORAL
  Filled 2018-10-26 (×11): qty 330

## 2018-10-26 MED ORDER — LACTATED RINGERS IV SOLN
INTRAVENOUS | Status: DC
  Administered 2018-10-26: 14:00:00 via INTRAVENOUS

## 2018-10-26 MED ORDER — FENTANYL CITRATE (PF) 100 MCG/2ML IJ SOLN
INTRAMUSCULAR | Status: AC
  Administered 2018-10-26: 50 ug via INTRAVENOUS
  Filled 2018-10-26: qty 2

## 2018-10-26 MED ORDER — ROCURONIUM BROMIDE 50 MG/5ML IV SOSY
PREFILLED_SYRINGE | INTRAVENOUS | Status: DC | PRN
  Administered 2018-10-26: 40 mg via INTRAVENOUS

## 2018-10-26 MED ORDER — PROPOFOL 10 MG/ML IV BOLUS
INTRAVENOUS | Status: AC
  Filled 2018-10-26: qty 40

## 2018-10-26 MED ORDER — ROCURONIUM BROMIDE 10 MG/ML (PF) SYRINGE
PREFILLED_SYRINGE | INTRAVENOUS | Status: AC
  Filled 2018-10-26: qty 20

## 2018-10-26 MED ORDER — LACTATED RINGERS IV SOLN
INTRAVENOUS | Status: DC | PRN
  Administered 2018-10-26: 15:00:00 via INTRAVENOUS

## 2018-10-26 MED ORDER — PROPOFOL 10 MG/ML IV BOLUS
INTRAVENOUS | Status: DC | PRN
  Administered 2018-10-26: 90 mg via INTRAVENOUS

## 2018-10-26 MED ORDER — PROPOFOL 10 MG/ML IV BOLUS
INTRAVENOUS | Status: AC
  Filled 2018-10-26: qty 20

## 2018-10-26 MED ORDER — FUROSEMIDE 20 MG PO TABS
20.0000 mg | ORAL_TABLET | Freq: Every day | ORAL | Status: DC
  Administered 2018-10-26: 20 mg via ORAL
  Filled 2018-10-26: qty 1

## 2018-10-26 MED ORDER — FENTANYL CITRATE (PF) 100 MCG/2ML IJ SOLN
INTRAMUSCULAR | Status: DC | PRN
  Administered 2018-10-26 (×3): 25 ug via INTRAVENOUS

## 2018-10-26 MED ORDER — SUGAMMADEX SODIUM 200 MG/2ML IV SOLN
INTRAVENOUS | Status: DC | PRN
  Administered 2018-10-26: 200 mg via INTRAVENOUS

## 2018-10-26 MED ORDER — LIDOCAINE 2% (20 MG/ML) 5 ML SYRINGE
INTRAMUSCULAR | Status: DC | PRN
  Administered 2018-10-26: 40 mg via INTRAVENOUS

## 2018-10-26 MED ORDER — 0.9 % SODIUM CHLORIDE (POUR BTL) OPTIME
TOPICAL | Status: DC | PRN
  Administered 2018-10-26: 1000 mL

## 2018-10-26 MED ORDER — FENTANYL CITRATE (PF) 100 MCG/2ML IJ SOLN
INTRAMUSCULAR | Status: AC
  Administered 2018-10-26: 25 ug via INTRAVENOUS
  Filled 2018-10-26: qty 2

## 2018-10-26 SURGICAL SUPPLY — 44 items
ADH SKN CLS APL DERMABOND .7 (GAUZE/BANDAGES/DRESSINGS) ×1
BIT DRILL CANN LRG QC 5X300 (BIT) ×2 IMPLANT
BNDG COHESIVE 6X5 TAN STRL LF (GAUZE/BANDAGES/DRESSINGS) ×3 IMPLANT
BRUSH SCRUB SURG 4.25 DISP (MISCELLANEOUS) ×3 IMPLANT
CHLORAPREP W/TINT 26ML (MISCELLANEOUS) ×3 IMPLANT
COVER SURGICAL LIGHT HANDLE (MISCELLANEOUS) ×4 IMPLANT
DERMABOND ADVANCED (GAUZE/BANDAGES/DRESSINGS) ×2
DERMABOND ADVANCED .7 DNX12 (GAUZE/BANDAGES/DRESSINGS) ×1 IMPLANT
DRAPE C-ARMOR (DRAPES) ×3 IMPLANT
DRAPE IMP U-DRAPE 54X76 (DRAPES) ×6 IMPLANT
DRAPE ORTHO SPLIT 77X108 STRL (DRAPES) ×6
DRAPE PROXIMA HALF (DRAPES) ×6 IMPLANT
DRAPE SURG 17X23 STRL (DRAPES) ×3 IMPLANT
DRAPE SURG ORHT 6 SPLT 77X108 (DRAPES) ×2 IMPLANT
DRAPE U-SHAPE 47X51 STRL (DRAPES) ×3 IMPLANT
DRSG MEPILEX BORDER 4X4 (GAUZE/BANDAGES/DRESSINGS) ×3 IMPLANT
ELECT REM PT RETURN 9FT ADLT (ELECTROSURGICAL) ×3
ELECTRODE REM PT RTRN 9FT ADLT (ELECTROSURGICAL) ×1 IMPLANT
GLOVE BIO SURGEON STRL SZ 6.5 (GLOVE) ×6 IMPLANT
GLOVE BIO SURGEON STRL SZ7.5 (GLOVE) ×10 IMPLANT
GLOVE BIO SURGEONS STRL SZ 6.5 (GLOVE) ×3
GLOVE BIOGEL PI IND STRL 6.5 (GLOVE) ×1 IMPLANT
GLOVE BIOGEL PI IND STRL 7.5 (GLOVE) ×1 IMPLANT
GLOVE BIOGEL PI INDICATOR 6.5 (GLOVE) ×2
GLOVE BIOGEL PI INDICATOR 7.5 (GLOVE) ×2
GOWN STRL REUS W/ TWL LRG LVL3 (GOWN DISPOSABLE) ×2 IMPLANT
GOWN STRL REUS W/TWL LRG LVL3 (GOWN DISPOSABLE) ×9
GUIDEWIRE THREADED 2.8 (WIRE) ×6 IMPLANT
KIT BASIN OR (CUSTOM PROCEDURE TRAY) ×3 IMPLANT
KIT TURNOVER KIT B (KITS) ×3 IMPLANT
NS IRRIG 1000ML POUR BTL (IV SOLUTION) ×3 IMPLANT
PACK GENERAL/GYN (CUSTOM PROCEDURE TRAY) ×3 IMPLANT
PAD ARMBOARD 7.5X6 YLW CONV (MISCELLANEOUS) ×10 IMPLANT
SCREW CANN 6.5X80 HEXAGONAL (Screw) ×4 IMPLANT
SCREW CANN FT SD 6.5X85 (Screw) ×2 IMPLANT
STAPLER VISISTAT 35W (STAPLE) ×3 IMPLANT
STOCKINETTE IMPERVIOUS LG (DRAPES) ×3 IMPLANT
SUT MNCRL AB 3-0 PS2 18 (SUTURE) ×3 IMPLANT
SUT VIC AB 2-0 CT1 27 (SUTURE) ×3
SUT VIC AB 2-0 CT1 TAPERPNT 27 (SUTURE) ×1 IMPLANT
TOWEL OR 17X24 6PK STRL BLUE (TOWEL DISPOSABLE) ×3 IMPLANT
TOWEL OR 17X26 10 PK STRL BLUE (TOWEL DISPOSABLE) ×4 IMPLANT
WASHER FOR 5.0 SCREWS (Washer) ×2 IMPLANT
WATER STERILE IRR 1000ML POUR (IV SOLUTION) ×3 IMPLANT

## 2018-10-26 NOTE — Anesthesia Postprocedure Evaluation (Signed)
Anesthesia Post Note  Patient: Cindy Gibbs  Procedure(s) Performed: CANNULATED HIP PINNING (Left )     Patient location during evaluation: PACU Anesthesia Type: General Level of consciousness: awake and alert, oriented and patient cooperative Pain management: pain level controlled Vital Signs Assessment: post-procedure vital signs reviewed and stable Respiratory status: spontaneous breathing, nonlabored ventilation, respiratory function stable and patient connected to nasal cannula oxygen Cardiovascular status: blood pressure returned to baseline and stable Postop Assessment: no apparent nausea or vomiting Anesthetic complications: no    Last Vitals:  Vitals:   10/26/18 1715 10/26/18 1745  BP: (!) 153/96 (!) 152/72  Pulse: 83 75  Resp: 16 19  Temp:    SpO2: 97% 96%    Last Pain:  Vitals:   10/26/18 1440  TempSrc:   PainSc: 5                  Marlia Schewe,E. Tobyn Osgood

## 2018-10-26 NOTE — Progress Notes (Signed)
PROGRESS NOTE    Cindy Gibbs  WJX:[id number] DOB: 04-17-1941 DOA: 10/25/2018 PCP: Tacey Ruiz, FNP    Brief Narrative:  78 year old female with history of atrial fibrillation on Eliquis, hyperlipidemia, gastroesophageal reflux disease, congestive heart failure, obstructive sleep apnea, CKD stage III had a fall at home and started to experience left hip pain.  Patient fell about 3 days back, when EMS arrived patient refused transport.  Patient continued to have worsening of the pain, came to the emergency department.  Work-up in the emergency department patient was found to have nondisplaced left femoral neck fracture on the x-ray.  Patient was also found to have urinary tract infection.  Patient had a fever of 100.6, bradycardia.  Plan for percutaneous fixation on 10/27/2018.  Assessment & Plan:   Principal Problem:   Fracture of femoral neck, left, closed (HCC) Active Problems:   Essential hypertension   GERD (gastroesophageal reflux disease)   Atrial fibrillation (HCC)   Depression   HLD (hyperlipidemia)   UTI (urinary tract infection)   Fall at home, initial encounter   CKD (chronic kidney disease), stage III (HCC)   Chronic diastolic CHF (congestive heart failure) (HCC)   ##Left femoral neck fracture -Appreciate orthopedic surgery follow-up -Continue with the pain management as needed -Patient had a last dose of Eliquis yesterday -Plan to have surgery tomorrow  ##Urinary tract infection -Follow-up with urine cultures -Continue with Rocephin  ##HTN:  -Continue home medications: Ziac, metoprolol -Which is also on Lasix -IV hydralazine prn  ##GERD (gastroesophageal reflux disease): -Protonix  ##Paroxysmal atrial Fibrillation: -CHA2DS2-VASc Score is 4, needs oral anticoagulation.  -Hold Eliquis for surgery -continue metoprolol   ##Depression: -Continue home Wellbutrin  ##HLD (hyperlipidemia): -Pravastatin  ##Fall at home, initial encounter: Patient  seems to have had mechanical fall.  CT head and CT of the C-spine is negative for bony fracture. -PT/OT when able to  ##CKD (chronic kidney disease), stage III (HCC):  -Close to baseline.  Baseline creatinine 0.9-1.0.  Her creatinine is 1.16, BUN 25. -Follow-up by BMP  ##Chronic diastolic CHF (congestive heart failure) (HCC):  -2D echo on 03/18/2015 showed EF of 55 to 60%.    Looks euvolemic  -Continue home Lasix  ##Elevated LFTs -Most likely from [place]  ##Morbid obesity -Counseling done regarding diet and exercise -Continue with low calorie, high protein diet    Perioperative Cardiac Risk: He has multiple comorbidities, including CHF with EF 55%, A fib, HLD, CKD-III and GERD. Currently patient is independent of her ADLs, IADLs. He is on diuretics, metoprolol and statin at home. No recent acute cardiac issues.  Patient does not have chest pain, shortness of breath, palpitation, leg edema.  No signs of acute CHF exacerbation.  EKG has no acute change. At this time point, no further work up is needed. His GUPTA score perioperative myocardial infarction or cardaic arrest is 0.76 %, which moderate risk. Pt has fever which is likely due to UTI. Start IV rocephin. Need f/u Bx and Ux, make sure she has no bacteremia before surgery.  -    DVT prophylaxis: SCDs Code Status: (Full Family Communication: No family member present  disposition Plan: (SNF  Consultants:   Orthopedic surgery   Antimicrobials:  Rocephin  Subjective: Complaining of left hip pain  Objective: Vitals:   10/25/18 2332 10/26/18 0408 10/26/18 0735 10/26/18 1014  BP: 102/67 112/72 (!) 102/57 (!) 106/59  Pulse: 63 (!) 59 65 62  Resp: Temp: 97.9 F (36.6 C) 98 F (  36.7 C) 97.7 F (36.5 C)   TempSrc: Oral Oral Oral   SpO2: 93% 100% 94%   Weight:      Height:        Intake/Output Summary (Last 24 hours) at 10/26/2018 1439 Last data filed at 10/26/2018 0900 Gross per 24 hour  Intake 40 ml   Output 650 ml  Net -610 ml   Filed Weights   10/25/18 1528 10/25/18 2208  Weight: 108 kg 108.4 kg    Examination:  General exam: Appears calm and comfortable  Respiratory system: Clear to auscultation. Respiratory effort normal. Cardiovascular system: S1 & S2 heard, RRR. No JVD, murmurs, rubs, gallops or clicks. No pedal edema. Gastrointestinal system: Abdomen is nondistended, soft and nontender. No organomegaly or masses felt. Normal bowel sounds heard. Central nervous system: Alert and oriented. No focal neurological deficits. Extremities: Externally rotated left leg  skin: No rashes, lesions or ulcers Psychiatry: Judgement and insight appear normal. Mood & affect appropriate.     Data Reviewed: I have personally reviewed following labs and imaging studies  CBC: Recent Labs  Lab 10/25/18 1558 10/26/18 0357  WBC 10.1 8.7  NEUTROABS 8.1*  --   HGB 11.0* 10.1*  HCT 36.4 33.3*  MCV 90.8 90.0  PLT 165 144*   Basic Metabolic Panel: Recent Labs  Lab 10/25/18 1558 10/26/18 0357  NA 137 137  K 4.5 4.1  CL 102 104  CO2 26 23  GLUCOSE 112* 93  BUN 25* 22  CREATININE 1.16* 1.16*  CALCIUM 9.3 8.8*   GFR: Estimated Creatinine Clearance: 49.6 mL/min (A) (by C-G formula based on SCr of 1.16 mg/dL (H)). Liver Function Tests: Recent Labs  Lab 10/25/18 1558  AST 77*  ALT 73*  ALKPHOS 131*  BILITOT 1.5*  PROT 7.6  ALBUMIN 3.5   No results for input(s): LIPASE, AMYLASE in the last 168 hours. No results for input(s): AMMONIA in the last 168 hours. Coagulation Profile: Recent Labs  Lab 10/25/18 1737  INR 1.2   Cardiac Enzymes: Recent Labs  Lab 10/25/18 1558  CKTOTAL 21*   BNP (last 3 results) No results for input(s): PROBNP in the last 8760 hours. HbA1C: No results for input(s): HGBA1C in the last 72 hours. CBG: No results for input(s): GLUCAP in the last 168 hours. Lipid Profile: No results for input(s): CHOL, HDL, LDLCALC, TRIG, CHOLHDL, LDLDIRECT in  the last 72 hours. Thyroid Function Tests: No results for input(s): TSH, T4TOTAL, FREET4, T3FREE, THYROIDAB in the last 72 hours. Anemia Panel: No results for input(s): VITAMINB12, FOLATE, FERRITIN, TIBC, IRON, RETICCTPCT in the last 72 hours. Sepsis Labs: Recent Labs  Lab 10/25/18 1630 10/25/18 2221  LATICACIDVEN 0.9 0.7    Recent Results (from the past 240 hour(s))  SARS Coronavirus 2 (CEPHEID - Performed in [facility] hospital lab), Hosp Order     Status: None   Collection Time: 10/25/18  3:59 PM   Specimen: Nasopharyngeal Swab  Result Value Ref Range Status   SARS Coronavirus 2 NEGATIVE NEGATIVE Final    Comment: (NOTE) If result is NEGATIVE SARS-CoV-2 target nucleic acids are NOT DETECTED. The SARS-CoV-2 RNA is generally detectable in upper and lower  respiratory specimens during the acute phase of infection. The lowest  concentration of SARS-CoV-2 viral copies this assay can detect is 250  copies / mL. A negative result does not preclude SARS-CoV-2 infection  and should not be used as the sole basis for treatment or other  patient management decisions.  A negative result  may occur with  improper specimen collection / handling, submission of specimen other  than nasopharyngeal swab, presence of viral mutation(s) within the  areas targeted by this assay, and inadequate number of viral copies  (<250 copies / mL). A negative result must be combined with clinical  observations, patient history, and epidemiological information. If result is POSITIVE SARS-CoV-2 target nucleic acids are DETECTED. The SARS-CoV-2 RNA is generally detectable in upper and lower  respiratory specimens dur ing the acute phase of infection.  Positive  results are indicative of active infection with SARS-CoV-2.  Clinical  correlation with patient history and other diagnostic information is  necessary to determine patient infection status.  Positive results do  not rule out bacterial infection or  co-infection with other viruses. If result is PRESUMPTIVE POSTIVE SARS-CoV-2 nucleic acids MAY BE PRESENT.   A presumptive positive result was obtained on the submitted specimen  and confirmed on repeat testing.  While 2019 novel coronavirus  (SARS-CoV-2) nucleic acids may be present in the submitted sample  additional confirmatory testing may be necessary for epidemiological  and / or clinical management purposes  to differentiate between  SARS-CoV-2 and other Sarbecovirus currently known to infect humans.  If clinically indicated additional testing with an alternate test  methodology [phone]([id number]) is advised. The SARS-CoV-2 RNA is generally  detectable in upper and lower respiratory sp ecimens during the acute  phase of infection. The expected result is Negative. Fact Sheet for Patients:  BoilerBrush.com.cyhttps://www.fda.gov/media/136312/download Fact Sheet for Healthcare Providers: https://pope.com/https://www.fda.gov/media/136313/download This test is not yet approved or cleared by the [place] FDA and has been authorized for detection and/or diagnosis of SARS-CoV-2 by FDA under an Emergency Use Authorization (EUA).  This EUA will remain in effect (meaning this test can be used) for the duration of the COVID-19 declaration under Section 564(b)(1) of the Act, 21 U.S.C. section 360bbb-3(b)(1), unless the authorization is terminated or revoked sooner. Performed at [facility], [address] [place], [place] [address]   Blood culture (routine x 2)     Status: None (Preliminary result)   Collection Time: 10/25/18  5:37 PM   Specimen: BLOOD  Result Value Ref Range Status   Specimen Description BLOOD BLOOD RIGHT HAND  Final   Special Requests   Final    BOTTLES DRAWN AEROBIC AND ANAEROBIC Blood Culture adequate volume   Culture   Final    NO GROWTH < 24 HOURS Performed at [facility], [address] [place], [place] [address]    Report Status PENDING  Incomplete  Blood culture (routine x 2)      Status: None (Preliminary result)   Collection Time: 10/25/18  5:37 PM   Specimen: BLOOD  Result Value Ref Range Status   Specimen Description BLOOD RIGHT ANTECUBITAL  Final   Special Requests   Final    BOTTLES DRAWN AEROBIC AND ANAEROBIC Blood Culture adequate volume   Culture   Final    NO GROWTH < 24 HOURS Performed at [facility], [address] [place], [place] [address]    Report Status PENDING  Incomplete  Surgical PCR screen     Status: Abnormal   Collection Time: 10/25/18 11:45 PM   Specimen: Nasal Mucosa; Nasal Swab  Result Value Ref Range Status   MRSA, PCR NEGATIVE NEGATIVE Final   Staphylococcus aureus POSITIVE (A) NEGATIVE Final    Comment: (NOTE) The Xpert SA Assay (FDA approved for NASAL specimens in patients 76 years of age and  older), is one component of a comprehensive surveillance program. It is not intended to diagnose infection nor to guide or monitor treatment. Performed at [facility], [address] [place], [place] [address]          Radiology Studies: Dg Chest 1 View  Result Date: 10/25/2018 CLINICAL DATA:  Larey SeatFell 1-2 days ago. Leg and back pain. Left femoral neck fracture. EXAM: CHEST  1 VIEW COMPARISON:  05/08/2018 FINDINGS: Cardiac silhouette is top-normal in size. No mediastinal or hilar masses. No convincing adenopathy. Clear lungs.  No pleural effusion or pneumothorax. Skeletal structures are demineralized but grossly intact. IMPRESSION: No acute cardiopulmonary disease. Electronically Signed   By: Amie Portlandavid  Ormond M.D.   On: 10/25/2018 17:50   Dg Pelvis 1-2 Views  Result Date: 10/25/2018 CLINICAL DATA:  Larey SeatFell 1 or 2 days ago.  Right leg pain. EXAM: PELVIS - 1-2 VIEW COMPARISON:  Abdomen and pelvis CT, 08/19/2008 FINDINGS: There is a non comminuted, subcapital fracture of the left femoral neck, without significant displacement but with valgus angulation. No other fracture.  No bone lesion. Hip joints, SI joints and symphysis pubis are  normally aligned. Skeletal structures are diffusely demineralized. They are soft tissue edema adjacent to the fractured left femoral neck. IMPRESSION: 1. Fracture of the left femoral neck as described. No dislocation. No other acute skeletal abnormality. Electronically Signed   By: Amie Portlandavid  Ormond M.D.   On: 10/25/2018 17:44   Dg Tibia/fibula Left  Result Date: 10/25/2018 CLINICAL DATA:  Fall 1-2 days ago.  Left leg pain. EXAM: LEFT TIBIA AND FIBULA - 2 VIEW COMPARISON:  None. FINDINGS: No fracture or bone lesion. Advanced degenerative changes noted of the knee. Knee joint normally aligned. Ankle joint is normally aligned. Skeletal structures are diffusely demineralized. Soft tissues are unremarkable. IMPRESSION: No fracture or dislocation. Electronically Signed   By: Amie Portlandavid  Ormond M.D.   On: 10/25/2018 17:46   Dg Tibia/fibula Right  Result Date: 10/25/2018 CLINICAL DATA:  Fall 1 or 2 days ago.  Right leg pain. EXAM: RIGHT TIBIA AND FIBULA - 2 VIEW COMPARISON:  None. FINDINGS: No fracture.  No bone lesion.  Bones are diffusely demineralized. There are advanced degenerative changes of the knee. Knee joint is normally aligned. Ankle joint is normally aligned. Soft tissues are unremarkable. IMPRESSION: No fracture or dislocation Electronically Signed   By: Amie Portlandavid  Ormond M.D.   On: 10/25/2018 17:45   Ct Head Wo Contrast  Result Date: 10/25/2018 CLINICAL DATA:  Per EMS pt fell on Wednesday EMS was dispatched and pt refused transport. Upon waking this AM pt experiencing pain in her left hip and leg. Pt has been able to walk since fall until today EXAM: CT HEAD WITHOUT CONTRAST CT CERVICAL SPINE WITHOUT CONTRAST TECHNIQUE: Multidetector CT imaging of the head and cervical spine was performed following the standard protocol without intravenous contrast. Multiplanar CT image reconstructions of the cervical spine were also generated. COMPARISON:  Head CT, 03/16/2015.  Brain MRI, 03/17/2015. FINDINGS: CT HEAD  FINDINGS Brain: No evidence of acute infarction, hemorrhage, hydrocephalus, extra-axial collection or mass lesion/mass effect. Mild patchy areas of subcortical and periventricular white matter hypoattenuation consistent with chronic microvascular ischemic change. Vascular: No hyperdense vessel or unexpected calcification. Skull: Normal. Negative for fracture or focal lesion. Sinuses/Orbits: Globes and orbits are unremarkable. Sinuses and mastoid air cells are clear. Other: None. CT CERVICAL SPINE FINDINGS Alignment: Normal. Skull base and vertebrae: No acute fracture. No primary bone lesion or focal pathologic process. Soft tissues  and spinal canal: No prevertebral fluid or swelling. No visible canal hematoma. Heterogeneous appearance of the thyroid suggesting bilateral poorly defined nodules. Measure lies esophagus is dilated. No neck masses or enlarged lymph nodes. Carotid vascular calcifications. Disc levels: Mild loss of disc height at C2-C3 and C3-C4. Moderate to marked loss of disc height at C4-C5 through C7-T1 with mild endplate sclerosis and endplate osteophytes. No convincing disc herniation. Upper chest: There is interstitial thickening with mild interval ground-glass opacities at the lung apices, right greater than left. This may be chronic. Consider congestive heart failure if there are consistent clinical findings. Infection is not excluded. Other: None. IMPRESSION: HEAD CT 1. No acute intracranial abnormalities. 2. No skull fracture. CERVICAL CT 1. No fracture or acute finding. 2. Degenerative changes as detailed. 3. Interstitial thickening and intervening areas of ground-glass opacity at the lung apices, right greater than left. This is consistent with interstitial airspace edema from congestive heart failure in the proper clinical setting. Infection or inflammation could have this appearance. Findings could be chronic. Electronically Signed   By: Amie Portland M.D.   On: 10/25/2018 18:37   Ct  Cervical Spine Wo Contrast  Result Date: 10/25/2018 CLINICAL DATA:  Per EMS pt fell on Wednesday EMS was dispatched and pt refused transport. Upon waking this AM pt experiencing pain in her left hip and leg. Pt has been able to walk since fall until today EXAM: CT HEAD WITHOUT CONTRAST CT CERVICAL SPINE WITHOUT CONTRAST TECHNIQUE: Multidetector CT imaging of the head and cervical spine was performed following the standard protocol without intravenous contrast. Multiplanar CT image reconstructions of the cervical spine were also generated. COMPARISON:  Head CT, 03/16/2015.  Brain MRI, 03/17/2015. FINDINGS: CT HEAD FINDINGS Brain: No evidence of acute infarction, hemorrhage, hydrocephalus, extra-axial collection or mass lesion/mass effect. Mild patchy areas of subcortical and periventricular white matter hypoattenuation consistent with chronic microvascular ischemic change. Vascular: No hyperdense vessel or unexpected calcification. Skull: Normal. Negative for fracture or focal lesion. Sinuses/Orbits: Globes and orbits are unremarkable. Sinuses and mastoid air cells are clear. Other: None. CT CERVICAL SPINE FINDINGS Alignment: Normal. Skull base and vertebrae: No acute fracture. No primary bone lesion or focal pathologic process. Soft tissues and spinal canal: No prevertebral fluid or swelling. No visible canal hematoma. Heterogeneous appearance of the thyroid suggesting bilateral poorly defined nodules. Measure lies esophagus is dilated. No neck masses or enlarged lymph nodes. Carotid vascular calcifications. Disc levels: Mild loss of disc height at C2-C3 and C3-C4. Moderate to marked loss of disc height at C4-C5 through C7-T1 with mild endplate sclerosis and endplate osteophytes. No convincing disc herniation. Upper chest: There is interstitial thickening with mild interval ground-glass opacities at the lung apices, right greater than left. This may be chronic. Consider congestive heart failure if there are  consistent clinical findings. Infection is not excluded. Other: None. IMPRESSION: HEAD CT 1. No acute intracranial abnormalities. 2. No skull fracture. CERVICAL CT 1. No fracture or acute finding. 2. Degenerative changes as detailed. 3. Interstitial thickening and intervening areas of ground-glass opacity at the lung apices, right greater than left. This is consistent with interstitial airspace edema from congestive heart failure in the proper clinical setting. Infection or inflammation could have this appearance. Findings could be chronic. Electronically Signed   By: Amie Portland M.D.   On: 10/25/2018 18:37   Ct Hip Left Wo Contrast  Result Date: 10/25/2018 CLINICAL DATA:  Left hip fracture. EXAM: CT OF THE LEFT HIP WITHOUT CONTRAST TECHNIQUE: Multidetector CT  imaging of the left hip was performed according to the standard protocol. Multiplanar CT image reconstructions were also generated. COMPARISON:  Radiographs from 10/25/2018 FINDINGS: Bones/Joint/Cartilage Subcapital left femoral neck fracture noted with mild lateral impaction and mild lateral cortical discontinuity. The adjacent portion of the pelvis appears normal. Little if any hip joint effusion. Ligaments Suboptimally assessed by CT. Muscles and Tendons Atrophic gluteus minimus muscle, not uncommon in this age group. Soft tissues Sigmoid colon diverticulosis. Left common femoral artery atherosclerotic calcification. IMPRESSION: 1. Subcapital left femoral neck fracture noted with mild lateral impaction. 2. Sigmoid colon diverticulosis. 3. Left common femoral artery atherosclerotic calcification. Electronically Signed   By: Van Clines M.D.   On: 10/25/2018 21:00   Dg Femur Min 2 Views Left  Result Date: 10/25/2018 CLINICAL DATA:  Fall leg pain.  1-2 days ago.  Left EXAM: LEFT FEMUR 2 VIEWS COMPARISON:  None. FINDINGS: There is a subcapital fracture of the left femoral neck. No fracture comminution or significant displacement. There is  valgus angulation. No other fractures. No bone lesions. Skeletal structures are demineralized. Hip and knee joints are normally aligned. There are advanced degenerative changes of the left knee joint. Small knee joint effusion. IMPRESSION: 1. Nondisplaced, non comminuted subcapital fracture of the left femoral neck with valgus angulation. 2. No other fractures.  No dislocation Electronically Signed   By: Lajean Manes M.D.   On: 10/25/2018 17:49   Dg Femur Min 2 Views Right  Result Date: 10/25/2018 CLINICAL DATA:  [place] 1-2 days ago.  Right leg pain. EXAM: RIGHT FEMUR 2 VIEWS COMPARISON:  None. FINDINGS: No fracture. No bone lesion. The skeletal structures are diffusely demineralized. Right hip joint normally spaced and aligned. Knee joint normally aligned with advanced degenerative changes. Soft tissues are unremarkable. IMPRESSION: No fracture or dislocation. Electronically Signed   By: Lajean Manes M.D.   On: 10/25/2018 17:47        Scheduled Meds:  [MAR Hold] bisoprolol-hydrochlorothiazide  1 tablet Oral Daily   [MAR Hold] buPROPion  300 mg Oral q morning - 10a   [MAR Hold] furosemide  20 mg Oral Daily   [MAR Hold] metoprolol succinate  50 mg Oral BID   multivitamin with minerals  1 tablet Oral Daily   [MAR Hold] pantoprazole  40 mg Oral Daily   [MAR Hold] pravastatin  40 mg Oral q1800   [MAR Hold] pregabalin  75 mg Oral BID   Ensure Max Protein  11 oz Oral BID   [MAR Hold] sertraline  200 mg Oral QHS   Continuous Infusions:  [MAR Hold] cefTRIAXone (ROCEPHIN)  IV Stopped (10/25/18 2106)   lactated ringers 10 mL/hr at 10/26/18 1357     LOS: 1 day    Time spent: 91 minutes    Alantis Bethune, MD [facility] Pager [phone]  If 7PM-7AM, please contact night-coverage [email or web address] Password [facility] 10/26/2018, 2:39 PM

## 2018-10-26 NOTE — Anesthesia Preprocedure Evaluation (Addendum)
Anesthesia Evaluation  Patient identified by MRN, date of birth, ID band Patient awake    Reviewed: Allergy & Precautions, NPO status , Patient's Chart, lab work & pertinent test results, reviewed documented beta blocker date and time   History of Anesthesia Complications Negative for: history of anesthetic complications  Airway Mallampati: II  TM Distance: >3 FB Neck ROM: Full    Dental  (+) Edentulous Upper, Missing, Dental Advisory Given, Poor Dentition   Pulmonary sleep apnea , COPD,  COPD inhaler,  10/25/2018 SARS coronavirus NEG   breath sounds clear to auscultation       Cardiovascular hypertension, Pt. on medications and Pt. on home beta blockers + dysrhythmias Atrial Fibrillation  Rhythm:Irregular Rate:Normal  '16 ECHO: EF 55-60%, mild-mod MR, mod TR   Neuro/Psych Depression Back pain: walks with a walker    GI/Hepatic Neg liver ROS, GERD  Medicated and Controlled,  Endo/Other  Morbid obesity  Renal/GU Renal InsufficiencyRenal disease     Musculoskeletal  (+) Arthritis , Osteoarthritis,    Abdominal (+) + obese,   Peds  Hematology  (+) Blood dyscrasia (Hb 10.1), anemia , eliquis   Anesthesia Other Findings   Reproductive/Obstetrics                            Anesthesia Physical Anesthesia Plan  ASA: III  Anesthesia Plan: General   Post-op Pain Management:    Induction: Intravenous  PONV Risk Score and Plan: 3 and Dexamethasone, Ondansetron and Treatment may vary due to age or medical condition  Airway Management Planned: Oral ETT  Additional Equipment:   Intra-op Plan:   Post-operative Plan: Extubation in OR  Informed Consent: I have reviewed the patients History and Physical, chart, labs and discussed the procedure including the risks, benefits and alternatives for the proposed anesthesia with the patient or authorized representative who has indicated his/her  understanding and acceptance.     Dental advisory given  Plan Discussed with: CRNA and Surgeon  Anesthesia Plan Comments:         Anesthesia Quick Evaluation

## 2018-10-26 NOTE — Progress Notes (Signed)
Pt daughter is requesting to speak with Orthopedic Surgeon, Dr. Doreatha Martin when he makes his rounds this AM to further inquire about her mother's surgery.  [facility] (Daughter): [phone]

## 2018-10-26 NOTE — Progress Notes (Signed)
Discussed care with [facility], patient's daughter. Discussed risks and benefits of surgery and she agrees to proceed. Plan for fixation later today.  Shona Needles, MD [facility] [phone] (phone) [phone] (office) [email or web address]

## 2018-10-26 NOTE — Transfer of Care (Signed)
Immediate Anesthesia Transfer of Care Note  Patient: Cindy Gibbs  Procedure(s) Performed: CANNULATED HIP PINNING (Left )  Patient Location: PACU  Anesthesia Type:General  Level of Consciousness: awake and alert   Airway & Oxygen Therapy: Patient Spontanous Breathing and Patient connected to nasal cannula oxygen  Post-op Assessment: Report given to RN and Post -op Vital signs reviewed and stable  Post vital signs: Reviewed and stable  Last Vitals:  Vitals Value Taken Time  BP    Temp    Pulse 70 10/26/18 1700  Resp 18 10/26/18 1700  SpO2 88 % 10/26/18 1700  Vitals shown include unvalidated device data.  Last Pain:  Vitals:   10/26/18 1440  TempSrc:   PainSc: 5          Complications: No apparent anesthesia complications

## 2018-10-26 NOTE — Progress Notes (Signed)
Initial Nutrition Assessment  DOCUMENTATION CODES:  Morbid obesity  INTERVENTION:  Ensure Max protein supplement BID, each supplement provides 150kcal and 30g of protein.  MVI with minerals  Given her obese body habitus and apparent lack of supplementation at home, would consider checking her Vit D status.   NUTRITION DIAGNOSIS:  Increased nutrient needs related to post op healing and acute illness (UTI) as evidenced by the estimated requirements for these conditions.  GOAL:  Patient will meet greater than or equal to 90% of their needs  MONITOR:  PO intake, Weight trends, Supplement acceptance, Labs, I & O's, Skin, Diet advancement  REASON FOR ASSESSMENT:  Consult Hip fracture protocol  ASSESSMENT:  78 y/o female PMHx Afib, HLD, GERD, CHF, OSA, CKD3, morbid obesity. Suffered fall on Wed 6/10, but refused to be taken by EMS to hospital. On Friday, she awoke with severe hip pain, aches, fever and poor appetite. In ED, found to have L hip fracture with UTI.   RD operating remotely d/t covid precautions. There is little information in chart and patient is currently in the OR and unable to be reached by phone.   Per chart, pt reported chills, fever, poor appetite and fatigue to admitting MD. However she denied any weight changes. Per remote [facility] encounter via Care Everywhere, pt was weighed at 244.5 lbs at PCP office vist 1/23. She is currently 238 lbs, suggesting her wt has been relatively stable over past several months.   Given her reportedly poor appetite and current infection, will order oral supplements for patient postop. Will add mvi with min since it does not appear she was taking a mvi at home, only b12. Can monitor intake and adjust interventions as appropriate  Labs: Bun/Creat: 22/1.16, Slightly elevated liver tests. Meds: Hctz, Lasix, ppi, IVF  Recent Labs  Lab 10/25/18 1558 10/26/18 0357  NA 137 137  K 4.5 4.1  CL 102 104  CO2 26 23  BUN 25* 22  CREATININE 1.16*  1.16*  CALCIUM 9.3 8.8*  GLUCOSE 112* 93    NUTRITION - FOCUSED PHYSICAL EXAM: Unable to conduct.   Diet Order:   Diet Order            Diet NPO time specified Except for: Ice Chips, Sips with Meds  Diet effective now             EDUCATION NEEDS:  No education needs have been identified at this time  Skin: L surgical wound to Hip  Last BM:  Unknown  Height:  Ht Readings from Last 1 Encounters:  10/25/18 5\' 4"  (1.626 m)    Weight:  Wt Readings from Last 1 Encounters:  10/25/18 108.4 kg    Wt Readings from Last 10 Encounters:  10/25/18 108.4 kg  03/17/15 118.6 kg   Ideal Body Weight:  54.54 kg  BMI:  Body mass index is 41.02 kg/m.  Estimated Nutritional Needs:  Kcal:  1500-1750 (14-16 kcal/kg bw) Protein:  82-93g Pro (1.5-1.7 Fluid:  1.5-1.8 L fluid (44ml/kcal)  Burtis Junes RD, LDN, CNSC Clinical Nutrition Available Tues-Sat via Pager: [phone] 10/26/2018 2:02 PM

## 2018-10-26 NOTE — Progress Notes (Signed)
TC from pt's daughter [facility] who called with another number to be called. Dr. Doreatha Martin aware of new number [phone].

## 2018-10-26 NOTE — Progress Notes (Signed)
1820 Received pt from PACU, A&O x4. Left hip dressing dry and intact. Little pain at this time.

## 2018-10-26 NOTE — Progress Notes (Signed)
Dr Haddix at bedside 

## 2018-10-26 NOTE — Progress Notes (Signed)
Spoke with Marge in OR this AM. She asked if patient was ready for surgery. Informed nurse that patient and daughter Audery Amel wanted to speak to the surgeon before making a definite decision on patient having surgery.

## 2018-10-26 NOTE — Anesthesia Procedure Notes (Signed)
Procedure Name: Intubation Date/Time: 10/26/2018 3:44 PM Performed by: Suzy Bouchard, CRNA Pre-anesthesia Checklist: Patient identified, Emergency Drugs available, Suction available, Patient being monitored and Timeout performed Patient Re-evaluated:Patient Re-evaluated prior to induction Oxygen Delivery Method: Circle system utilized Preoxygenation: Pre-oxygenation with 100% oxygen Induction Type: IV induction Ventilation: Mask ventilation without difficulty Laryngoscope Size: Miller and 2 Grade View: Grade I Tube type: Oral Tube size: 7.0 mm Number of attempts: 1 Airway Equipment and Method: Stylet Placement Confirmation: ETT inserted through vocal cords under direct vision,  positive ETCO2 and breath sounds checked- equal and bilateral Secured at: 23 cm Tube secured with: Tape Dental Injury: Teeth and Oropharynx as per pre-operative assessment

## 2018-10-26 NOTE — Op Note (Signed)
Orthopaedic Surgery Operative Note (CSN: [id number] ) Date of Surgery: 10/26/2018  Admit Date: 10/25/2018   Diagnoses: Pre-Op Diagnoses: Left valgus impacted femoral neck fracture  Post-Op Diagnosis: Same  Procedures: CPT 27235-Percutaneous fixation of left femoral neck  Surgeons : Primary: Armella Stogner, Thomasene Lot, MD  Assistant: Patrecia Pace, PA-C  Location: OR 6   Anesthesia:General  Antibiotics: Ancef 2g preop   Tourniquet time:None  Estimated Blood DDUK:02 mL  Complications:None   Specimens:None   Implants: Implant Name Type Inv. Item Serial No. Manufacturer Lot No. LRB No. Used Action  SCREW CANN 6.5X80 HEXAGONAL - [id number] Screw SCREW CANN 6.5X80 HEXAGONAL  [employer]  Left 2 Implanted  SCREW 6.5 CANN x 85 MM Screw   [employer]  Left 1 Implanted     Indications for Surgery: 78 year old female with a history of A. fib on Eliquis, hyperlipidemia, congestive heart failure, obstructive sleep apnea and chronic kidney disease with a nondisplaced left femoral neck fracture. I recommended proceeding with percutaneous fixation the left femoral neck.  This will prevent further displacement and need for a hemiarthroplasty in the future.  I discussed risks and benefits with the patient.  Risks included but not limited to bleeding, infection, malunion, nonunion, posttraumatic arthritis, cut out of the screws, need for further surgery including hemiarthroplasty, and possibility of DVT. The patient and her daughter agree to surgery.  Operative Findings: Percutaneous fixation of left nondisplaced femoral neck fracture using Synthes 6.5 mm fully threaded cannulated screws in an inverted triangle pattern  Procedure: The patient was identified in the preoperative holding area. Consent was confirmed with the patient and their family and all questions were answered. The operative extremity was marked after confirmation with the patient they were then brought back to the operating room by  our anesthesia colleagues. They were placed under general anesthesia and carefully transferred to a radiolucent flat top table. A bump was placed under the operative hip and fluoroscopy was used to confirm that the fracture had not displaced. The operative extremity was then prepped and draped in usual sterile fashion. A preoperative timeout was performed to verify the patient, the procedure, and the extremity. Preoperative antibiotics were dosed.  Using fluoroscopy as a guide I marked out an incision. I carried this down through skin and the IT band. I used 2.19mm guidepins to direct up the femoral neck in an inverted triangle. I placed anterior superior guidepin, a posterior superior guidepin and a posterior inferior guidepin. I confirmed positioning with fluoroscopy and then proceeded to measure and placed 6.53mm screws across the femoral neck.  I chose to use fully threaded screws as her fracture was mildly valgus impacted but mainly nondisplaced.  Screws had excellent purchase.  Final fluoroscopic images were obtained confirming length on all screws. An approach withdrawal technique was used to make sure all screws were extra-articular. The incision was irrigated and closed with 2-0 vicryl, 3-0 monocryl and dermabond. A dressing was placed. The patient was awoken from anesthesia and taken to the PACU in stable condition.  Post Op Plan/Instructions: The patient will be weightbearing as tolerated to the left lower extremity.  She will receive postoperative Ancef.  She may be restarted on her Eliquis postoperative day 1.  Physical therapy will be consulted for mobilization.  I was present and performed the entire surgery.  Patrecia Pace, PA-C did assist me throughout the case. An assistant was necessary given the difficulty in approach, maintenance of reduction and ability to instrument the fracture.   Katha Hamming, MD  [facility]

## 2018-10-26 NOTE — Consult Note (Signed)
Orthopaedic Trauma Service (OTS) Consult   Patient ID: Cindy ENCALADE MRN: [id number] DOB/AGE: 1940-08-13 78 y.o.  Reason for Consult:Left femoral neck fracture Referring Physician: Dr. Chaney Malling, MD [facility]  HPI: Cindy Gibbs is an 78 y.o. female who is being seen in consultation at the request of Dr. Silverio Lay for evaluation of left hip fracture.  She has a history of A. fib on Eliquis, hyperlipidemia, congestive heart failure, obstructive sleep apnea and chronic kidney disease who fell and has left hip pain.  According to the patient as well as records patient likely fell on Wednesday.  EMS was dispatched but she refused transport.  She continued to have significant pain in her left leg and hip when she walked.  She states that she is achy all over.  But she describes a sharp pain in her hip with ambulation.  She presented to the emergency room where x-rays showed a femoral neck fracture that appears nondisplaced.  CT scan confirmed the fact that it was nondisplaced.  Denies any other injuries anywhere else.  Patient was admitted overnight to the hospitalist service.  Patient was found to have a UTI.  She was started on Rocephin.  Patient states that she lives with her daughter and husband.  She ambulates with the use of a walker.  Past Medical History:  Diagnosis Date  . Atrial fibrillation (HCC)   . Back pain   . Bilateral knee pain   . CHF (congestive heart failure) (HCC)   . Depression   . Essential hypertension   . GERD (gastroesophageal reflux disease)   . HLD (hyperlipidemia)   . Obesity   . OSA (obstructive sleep apnea)     Past Surgical History:  Procedure Laterality Date  . ABDOMINAL HYSTERECTOMY    . CHOLECYSTECTOMY      Family History  Problem Relation Age of Onset  . Lung cancer Mother   . Heart disease Father   . Cirrhosis Brother     Social History:  reports that she has never smoked. She has never used smokeless tobacco. No history on file for alcohol and  drug.  Allergies:  Allergies  Allergen Reactions  . Codeine Nausea Only    Medications:  No current facility-administered medications on file prior to encounter.    Current Outpatient Medications on File Prior to Encounter  Medication Sig Dispense Refill  . acetaminophen (TYLENOL) 325 MG tablet Take 325 mg by mouth every 6 (six) hours as needed for mild pain or headache.     . albuterol (PROAIR HFA) 108 (90 Base) MCG/ACT inhaler Inhale 2 puffs into the lungs every 6 (six) hours as needed for wheezing or shortness of breath.    Marland Kitchen albuterol (PROVENTIL) (2.5 MG/3ML) 0.083% nebulizer solution Take 2.5 mg by nebulization every 6 (six) hours as needed for wheezing or shortness of breath.     Marland Kitchen apixaban (ELIQUIS) 5 MG TABS tablet Take 5 mg by mouth 2 (two) times daily.    . bisoprolol-hydrochlorothiazide (ZIAC) 5-6.25 MG tablet Take 1 tablet by mouth daily.    Marland Kitchen buPROPion (WELLBUTRIN XL) 150 MG 24 hr tablet Take 300 mg by mouth every morning.    . diphenhydrAMINE (BENADRYL) 25 mg capsule Take 25 mg by mouth every 6 (six) hours as needed for allergies (or nasal drainage).     . furosemide (LASIX) 20 MG tablet Take 20 mg by mouth daily.    Marland Kitchen lidocaine (XYLOCAINE) 5 % ointment Apply 1 application topically as needed  for mild pain (to affected site).     Marland Kitchen lovastatin (MEVACOR) 40 MG tablet Take 40 mg by mouth at bedtime.     . metoprolol succinate (TOPROL-XL) 50 MG 24 hr tablet Take 50 mg by mouth 2 (two) times a day. Take with or immediately following a meal.    . nitroGLYCERIN (NITROSTAT) 0.4 MG SL tablet Place 0.4 mg under the tongue every 5 (five) minutes x 3 doses as needed (CALL 9-1-1, if no relief).     Marland Kitchen omeprazole (PRILOSEC) 20 MG capsule Take 20 mg by mouth 2 (two) times a day.     . Oxycodone HCl 20 MG TABS Take 20 mg by mouth every 4 (four) hours as needed (for pain).     . pregabalin (LYRICA) 75 MG capsule Take 75 mg by mouth 2 (two) times daily.    . sertraline (ZOLOFT) 100 MG tablet  Take 200 mg by mouth at bedtime.     . traMADol (ULTRAM) 50 MG tablet Take 50 mg by mouth daily as needed (for pain).    . cyanocobalamin (,VITAMIN B-12,) 1000 MCG/ML injection Inject 1,000 mcg into the muscle every 30 (thirty) days.      ROS: Constitutional: No fever or chills Vision: No changes in vision ENT: No difficulty swallowing CV: No chest pain Pulm: No SOB or wheezing GI: No nausea or vomiting GU: No urgency or inability to hold urine Skin: No poor wound healing Neurologic: No numbness or tingling Psychiatric: No depression or anxiety Heme: No bruising Allergic: No reaction to medications or food   Exam: Blood pressure 112/72, pulse (!) 59, temperature 98 F (36.7 C), temperature source Oral, resp. rate 15, height 5\' 4"  (1.626 m), weight 108.4 kg, SpO2 100 %. General: No acute distress Orientation: Awake alert and oriented x3 Mood and Affect: Cooperative and pleasant Gait: Did not assess this morning Coordination and balance: Within normal limits  Injured Extremity (CV, lymph, sensation, reflexes): Left lower extremity: Reveals skin without lesions.  There is pain with gentle hip flexion and internal and external rotation.  Leg lengths are similar in length.  No significant shortening.  Patient is neurovascularly intact.  She has a warm well-perfused foot.  Right lower extremity: Skin without lesions. No tenderness to palpation. Full painless ROM, full strength in each muscle groups without evidence of instability.   Medical Decision Making: Imaging: X-rays of the hip and CT scan shows nondisplaced left femoral neck fracture.  There is some mild valgus impaction.  No other fractures noted on the other radiographs.  Labs:  Results for orders placed or performed during the hospital encounter of 10/25/18 (from the past 24 hour(s))  CBC with Differential/Platelet     Status: Abnormal   Collection Time: 10/25/18  3:58 PM  Result Value Ref Range   WBC 10.1 4.0 - 10.5 K/uL    RBC 4.01 3.87 - 5.11 MIL/uL   Hemoglobin 11.0 (L) 12.0 - 15.0 g/dL   HCT 36.4 36.0 - 46.0 %   MCV 90.8 80.0 - 100.0 fL   MCH 27.4 26.0 - 34.0 pg   MCHC 30.2 30.0 - 36.0 g/dL   RDW 15.9 (H) 11.5 - 15.5 %   Platelets 165 150 - 400 K/uL   nRBC 0.0 0.0 - 0.2 %   Neutrophils Relative % 80 %   Neutro Abs 8.1 (H) 1.7 - 7.7 K/uL   Lymphocytes Relative 8 %   Lymphs Abs 0.8 0.7 - 4.0 K/uL   Monocytes Relative 8 %  Monocytes Absolute 0.8 0.1 - 1.0 K/uL   Eosinophils Relative 3 %   Eosinophils Absolute 0.3 0.0 - 0.5 K/uL   Basophils Relative 1 %   Basophils Absolute 0.1 0.0 - 0.1 K/uL   Immature Granulocytes 0 %   Abs Immature Granulocytes 0.04 0.00 - 0.07 K/uL  Comprehensive metabolic panel     Status: Abnormal   Collection Time: 10/25/18  3:58 PM  Result Value Ref Range   Sodium 137 135 - 145 mmol/L   Potassium 4.5 3.5 - 5.1 mmol/L   Chloride 102 98 - 111 mmol/L   CO2 26 22 - 32 mmol/L   Glucose, Bld 112 (H) 70 - 99 mg/dL   BUN 25 (H) 8 - 23 mg/dL   Creatinine, Ser 1.611.16 (H) 0.44 - 1.00 mg/dL   Calcium 9.3 8.9 - 09.610.3 mg/dL   Total Protein 7.6 6.5 - 8.1 g/dL   Albumin 3.5 3.5 - 5.0 g/dL   AST 77 (H) 15 - 41 U/L   ALT 73 (H) 0 - 44 U/L   Alkaline Phosphatase 131 (H) 38 - 126 U/L   Total Bilirubin 1.5 (H) 0.3 - 1.2 mg/dL   GFR calc non Af Amer 46 (L) >60 mL/min   GFR calc Af Amer 53 (L) >60 mL/min   Anion gap 9 5 - 15  CK     Status: Abnormal   Collection Time: 10/25/18  3:58 PM  Result Value Ref Range   Total CK 21 (L) 38 - 234 U/L  SARS Coronavirus 2 (CEPHEID - Performed in [facility] hospital lab), Hosp Order     Status: None   Collection Time: 10/25/18  3:59 PM   Specimen: Nasopharyngeal Swab  Result Value Ref Range   SARS Coronavirus 2 NEGATIVE NEGATIVE  I-stat troponin, ED     Status: None   Collection Time: 10/25/18  4:26 PM  Result Value Ref Range   Troponin i, poc 0.01 0.00 - 0.08 ng/mL   Comment 3          Lactic acid, plasma     Status: None   Collection  Time: 10/25/18  4:30 PM  Result Value Ref Range   Lactic Acid, Venous 0.9 0.5 - 1.9 mmol/L  Protime-INR     Status: Abnormal   Collection Time: 10/25/18  5:37 PM  Result Value Ref Range   Prothrombin Time 15.3 (H) 11.4 - 15.2 seconds   INR 1.2 0.8 - 1.2  Urinalysis, Routine w reflex microscopic     Status: Abnormal   Collection Time: 10/25/18  7:23 PM  Result Value Ref Range   Color, Urine AMBER (A) YELLOW   APPearance HAZY (A) CLEAR   Specific Gravity, Urine 1.019 1.005 - 1.030   pH 5.0 5.0 - 8.0   Glucose, UA NEGATIVE NEGATIVE mg/dL   Hgb urine dipstick SMALL (A) NEGATIVE   Bilirubin Urine NEGATIVE NEGATIVE   Ketones, ur NEGATIVE NEGATIVE mg/dL   Protein, ur 30 (A) NEGATIVE mg/dL   Nitrite NEGATIVE NEGATIVE   Leukocytes,Ua TRACE (A) NEGATIVE   RBC / HPF 0-5 0 - 5 RBC/hpf   WBC, UA 0-5 0 - 5 WBC/hpf   Bacteria, UA MANY (A) NONE SEEN   Squamous Epithelial / LPF 0-5 0 - 5   Hyaline Casts, UA PRESENT   Lactic acid, plasma     Status: None   Collection Time: 10/25/18 10:21 PM  Result Value Ref Range   Lactic Acid, Venous 0.7 0.5 - 1.9 mmol/L  Brain natriuretic peptide     Status: Abnormal   Collection Time: 10/25/18 10:21 PM  Result Value Ref Range   B Natriuretic Peptide 471.2 (H) 0.0 - 100.0 pg/mL  APTT     Status: Abnormal   Collection Time: 10/25/18 10:21 PM  Result Value Ref Range   aPTT 39 (H) 24 - 36 seconds  Type and screen [facility]     Status: None   Collection Time: 10/25/18 10:21 PM  Result Value Ref Range   ABO/RH(D) A POS    Antibody Screen NEG    Sample Expiration      10/28/2018,2359 Performed at [facility], [address] [place], [place] [address]   ABO/Rh     Status: None   Collection Time: 10/25/18 10:21 PM  Result Value Ref Range   ABO/RH(D)      A POS Performed at [facility], [address] [place], [place] [address]   Surgical PCR screen     Status: Abnormal   Collection Time: 10/25/18 11:45 PM    Specimen: Nasal Mucosa; Nasal Swab  Result Value Ref Range   MRSA, PCR NEGATIVE NEGATIVE   Staphylococcus aureus POSITIVE (A) NEGATIVE  CBC     Status: Abnormal   Collection Time: 10/26/18  3:57 AM  Result Value Ref Range   WBC 8.7 4.0 - 10.5 K/uL   RBC 3.70 (L) 3.87 - 5.11 MIL/uL   Hemoglobin 10.1 (L) 12.0 - 15.0 g/dL   HCT 09.833.3 (L) 11.936.0 - 14.746.0 %   MCV 90.0 80.0 - 100.0 fL   MCH 27.3 26.0 - 34.0 pg   MCHC 30.3 30.0 - 36.0 g/dL   RDW 82.915.9 (H) 56.211.5 - 13.015.5 %   Platelets 144 (L) 150 - 400 K/uL   nRBC 0.0 0.0 - 0.2 %  Basic metabolic panel     Status: Abnormal   Collection Time: 10/26/18  3:57 AM  Result Value Ref Range   Sodium 137 135 - 145 mmol/L   Potassium 4.1 3.5 - 5.1 mmol/L   Chloride 104 98 - 111 mmol/L   CO2 23 22 - 32 mmol/L   Glucose, Bld 93 70 - 99 mg/dL   BUN 22 8 - 23 mg/dL   Creatinine, Ser 8.651.16 (H) 0.44 - 1.00 mg/dL   Calcium 8.8 (L) 8.9 - 10.3 mg/dL   GFR calc non Af Amer 46 (L) >60 mL/min   GFR calc Af Amer 53 (L) >60 mL/min   Anion gap 10 5 - 15    Medical history and chart was reviewed  Assessment/Plan: 78 year old female with a history of A. fib on Eliquis, hyperlipidemia, congestive heart failure, obstructive sleep apnea and chronic kidney disease with a nondisplaced left femoral neck fracture.  I would recommend proceeding with percutaneous fixation the left femoral neck.  This will prevent further displacement and need for a hemiarthroplasty in the future.  I discussed risks and benefits with the patient.  Risks included but not limited to bleeding, infection, malunion, nonunion, posttraumatic arthritis, cut out of the screws, need for further surgery including hemiarthroplasty, and possibility of DVT.  Patient would like me to talk to her daughter.  Multiple attempts were made to contact the daughter with a number in the chart.  Unfortunately the call was not able to go through.  As result I will delay her surgery for later this morning or later this  afternoon.  Hopefully will be able to get in touch with  her daughter to discuss risks and benefits.   Roby LoftsKevin P. Isabell Bonafede, MD [facility] [phone] (phone) [phone] (office) [email or web address]

## 2018-10-26 NOTE — Progress Notes (Signed)
This charge RN was informed by the patients RN, Ursula Alert, that the pre-op charge RN, Shawn Route, called for report in preparation to take the patient to surgery on this morning at 6:30 am.   Prior to this call from the pre-op charge RN the patient disclosed with her nurse, Judeen Hammans, that she did not want to proceed with surgery until she and her daughter spoke with the surgeon related to the surgical plan of care. However, patient remained NPO at midnight in anticipation of pending surgery.   This charge RN verified with supporting documentation from the surgeons time stamped note written on 10/25/2018 at 2030 that the patient was to remain NPO and that a formal consult would commence the following morning.  This charge RN relayed to the pre-op charge RN reference of the aforementioned surgeons note of which she acknowledged. Pre-op charge RN informed this charge RN that she would delay transport of the patient to the surgical area until after 0700 so that the surgeon may have time to speak with both the patient and her daughter related to the surgical plan of care.

## 2018-10-27 DIAGNOSIS — K210 Gastro-esophageal reflux disease with esophagitis: Secondary | ICD-10-CM

## 2018-10-27 DIAGNOSIS — F320 Major depressive disorder, single episode, mild: Secondary | ICD-10-CM

## 2018-10-27 DIAGNOSIS — N3000 Acute cystitis without hematuria: Secondary | ICD-10-CM

## 2018-10-27 LAB — CBC
HCT: 33 % — ABNORMAL LOW (ref 36.0–46.0)
Hemoglobin: 10.2 g/dL — ABNORMAL LOW (ref 12.0–15.0)
MCH: 27.7 pg (ref 26.0–34.0)
MCHC: 30.9 g/dL (ref 30.0–36.0)
MCV: 89.7 fL (ref 80.0–100.0)
Platelets: 166 10*3/uL (ref 150–400)
RBC: 3.68 MIL/uL — ABNORMAL LOW (ref 3.87–5.11)
RDW: 15.7 % — ABNORMAL HIGH (ref 11.5–15.5)
WBC: 10.7 10*3/uL — ABNORMAL HIGH (ref 4.0–10.5)
nRBC: 0 % (ref 0.0–0.2)

## 2018-10-27 NOTE — Progress Notes (Signed)
Tele tech with call concerning pt with 2 pauses. First: 04:30 am at 02.05 sec's. Second: 06:58 am at 2.33 sec's. Assigned Triad MD  Made aware via text both times. VSS, pt asymptomatic, cont pox maintained, 2L  Nasal cannula intact, IVF's infusing.  Charge Nurse and day shift assigned nurse updated to the above information

## 2018-10-27 NOTE — Plan of Care (Signed)
?  Problem: Elimination: ?Goal: Will not experience complications related to bowel motility ?Outcome: Progressing ?  ?Problem: Pain Managment: ?Goal: General experience of comfort will improve ?Outcome: Progressing ?  ?Problem: Safety: ?Goal: Ability to remain free from injury will improve ?Outcome: Progressing ?  ?

## 2018-10-27 NOTE — Evaluation (Signed)
Physical Therapy Evaluation Patient Details Name: Cindy CaffeyBarbara C Gibbs MRN: [id number] DOB: 01/01/1941 Today's Date: 10/27/2018   History of Present Illness  Pt is a 78 y.o. female with medical history significant of A. fib on Eliquis, hyperlipidemia, GERD, CHF, OSA, and CKD-3. She presented to the ED following a fall and left hip pain. Xray revealed L hip fx. She underwent ORIF 10/26/18.   Clinical Impression   Pt admitted with above diagnosis. Pt currently with functional limitations due to the deficits listed below (see PT Problem List). PTA pt lived at home with her husband and daughter. She had a PCA M-F 12-5 to assist with ADLs. She ambulated mod I with rollator household distances. On eval, pt required heavy mod assist bed mobility. She was unable to progress beyond EOB. Pt will benefit from skilled PT to increase their independence and safety with mobility to allow discharge to the venue listed below.       Follow Up Recommendations SNF;Supervision/Assistance - 24 hour    Equipment Recommendations  None recommended by PT    Recommendations for Other Services       Precautions / Restrictions Precautions Precautions: Fall Restrictions LLE Weight Bearing: Weight bearing as tolerated      Mobility  Bed Mobility Overal bed mobility: Needs Assistance Bed Mobility: Supine to Sit;Sit to Supine     Supine to sit: Mod assist;HOB elevated Sit to supine: Mod assist;HOB elevated   General bed mobility comments: heavy mod assist, +rail, cues for sequencing  Transfers                 General transfer comment: Attempted sit to stand with RW x 3 trials. Pt unable to clear bottom from bed with max assist. Little effort noted from pt, possibly due to fear of pain/falling.  Ambulation/Gait             General Gait Details: unable  Stairs            Wheelchair Mobility    Modified Rankin (Stroke Patients Only)       Balance Overall balance assessment: Needs  assistance Sitting-balance support: Single extremity supported;Feet supported Sitting balance-Leahy Scale: Good Sitting balance - Comments: Pt sat EOB x 5 minutes supervision.                                     Pertinent Vitals/Pain Pain Assessment: 0-10 Pain Score: 8  Pain Location: L hip Pain Descriptors / Indicators: Grimacing;Operative site guarding;Sore Pain Intervention(s): Limited activity within patient's tolerance;Monitored during session;Repositioned    Home Living Family/patient expects to be discharged to:: Private residence Living Arrangements: Spouse/significant other;Children Available Help at Discharge: Family;Personal care attendant;Available 24 hours/day Type of Home: House Home Access: Ramped entrance     Home Layout: One level Home Equipment: Walker - 4 wheels;Bedside commode;Tub bench;Hospital bed Additional Comments: aide 12-5 Mon-Fri, husband works 2nd shift    Prior Function Level of Independence: Needs assistance   Gait / Transfers Assistance Needed: ambulates household distances with rollator  ADL's / Homemaking Assistance Needed: aide assists with ADLs        Hand Dominance   Dominant Hand: Right    Extremity/Trunk Assessment   Upper Extremity Assessment Upper Extremity Assessment: Generalized weakness    Lower Extremity Assessment Lower Extremity Assessment: LLE deficits/detail;Generalized weakness LLE Deficits / Details: s/p ORIF L hip       Communication   Communication: No  difficulties  Cognition Arousal/Alertness: Awake/alert Behavior During Therapy: WFL for tasks assessed/performed;Anxious Overall Cognitive Status: Within Functional Limits for tasks assessed                                        General Comments      Exercises General Exercises - Lower Extremity Ankle Circles/Pumps: AROM;Both;20 reps;Supine Heel Slides: AAROM;Left;5 reps;Supine Hip ABduction/ADduction: AAROM;Left;5  reps;Supine   Assessment/Plan    PT Assessment Patient needs continued PT services  PT Problem List Decreased strength;Decreased mobility;Decreased activity tolerance;Pain;Obesity;Decreased balance       PT Treatment Interventions DME instruction;Therapeutic activities;Therapeutic exercise;Gait training;Patient/family education;Balance training;Functional mobility training    PT Goals (Current goals can be found in the Care Plan section)  Acute Rehab PT Goals Patient Stated Goal: home PT Goal Formulation: With patient Time For Goal Achievement: 11/10/18 Potential to Achieve Goals: Fair    Frequency Min 3X/week   Barriers to discharge        Co-evaluation               AM-PAC PT "6 Clicks" Mobility  Outcome Measure Help needed turning from your back to your side while in a flat bed without using bedrails?: A Lot Help needed moving from lying on your back to sitting on the side of a flat bed without using bedrails?: A Lot Help needed moving to and from a bed to a chair (including a wheelchair)?: A Lot Help needed standing up from a chair using your arms (e.g., wheelchair or bedside chair)?: A Lot Help needed to walk in hospital room?: Total Help needed climbing 3-5 steps with a railing? : Total 6 Click Score: 10    End of Session Equipment Utilized During Treatment: Oxygen;Gait belt Activity Tolerance: Patient limited by pain Patient left: in bed;with call bell/phone within reach;with bed alarm set Nurse Communication: Mobility status PT Visit Diagnosis: Other abnormalities of gait and mobility (R26.89);Pain;Muscle weakness (generalized) (M62.81) Pain - Right/Left: Left Pain - part of body: Hip    Time: 3151-7616 PT Time Calculation (min) (ACUTE ONLY): 32 min   Charges:   PT Evaluation $PT Eval Moderate Complexity: 1 Mod PT Treatments $Therapeutic Activity: 8-22 mins        Lorrin Goodell, PT  Office # [phone] Pager [phone]   Lorriane Shire 10/27/2018, 11:24 AM

## 2018-10-27 NOTE — NC FL2 (Signed)
[place] MEDICAID FL2 LEVEL OF CARE SCREENING TOOL     IDENTIFICATION  Patient Name: Cindy Gibbs Birthdate: 1940-09-30 Sex: female Admission Date (Current Location): 10/25/2018  [facility] and [place] Number:  [employer] and Address:  [facility], [place] [address], [place], [place] [address]      Provider Number: [id number]  Attending Physician Name and Address:  Monica Becton, MD  Relative Name and Phone Number:  Sterling Big [phone]    Current Level of Care: Hospital Recommended Level of Care: [place] Prior Approval Number:    Date Approved/Denied: 04/04/15 PASRR Number: [id number]  Discharge Plan: SNF    Current Diagnoses: Patient Active Problem List   Diagnosis Date Noted  . Fall at home, initial encounter 10/25/2018  . Fracture of femoral neck, left, closed ([place]) 10/25/2018  . Fever 10/25/2018  . CKD (chronic kidney disease), stage III ([place]) 10/25/2018  . Chronic diastolic CHF (congestive heart failure) ([place]) 10/25/2018  . UTI (urinary tract infection) 03/18/2015  . Acute encephalopathy 03/17/2015  . Right sided weakness 03/17/2015  . HLD (hyperlipidemia) 03/17/2015  . Essential hypertension   . GERD (gastroesophageal reflux disease)   . Back pain   . Bilateral knee pain   . OSA (obstructive sleep apnea)   . Obesity   . Atrial fibrillation ([place])   . Depression     Orientation RESPIRATION BLADDER Height & Weight     Self, Time, Situation, Place  O2(nasal cannula 2L/min) Continent, External catheter Weight: 238 lb 15.7 oz (108.4 kg) Height:  5\' 4"  (162.6 cm)  BEHAVIORAL SYMPTOMS/MOOD NEUROLOGICAL BOWEL NUTRITION STATUS      Continent Diet(see discharge summary)  AMBULATORY STATUS COMMUNICATION OF NEEDS Skin   Extensive Assist Verbally Surgical wounds(left hip closed surgical incision)                       Personal Care Assistance Level of Assistance  Bathing, Dressing, Total care,  Feeding Bathing Assistance: Limited assistance Feeding assistance: Independent Dressing Assistance: Limited assistance Total Care Assistance: Maximum assistance   Functional Limitations Info  Sight, Hearing, Speech Sight Info: Adequate Hearing Info: Adequate Speech Info: Adequate(upper dentures)    SPECIAL CARE FACTORS FREQUENCY  PT (By licensed PT), OT (By licensed OT)     PT Frequency: min 5x weekly OT Frequency: min 5x weekly            Contractures Contractures Info: Not present    Additional Factors Info  Code Status, Allergies Code Status Info: full Allergies Info: Codeine           Current Medications (10/27/2018):  This is the current hospital active medication list Current Facility-Administered Medications  Medication Dose Route Frequency Provider Last Rate Last Dose  . acetaminophen (TYLENOL) tablet 325 mg  325 mg Oral Q6H PRN Delray Alt, PA-C      . albuterol (PROVENTIL) (2.5 MG/3ML) 0.083% nebulizer solution 2.5 mg  2.5 mg Nebulization Q4H PRN Delray Alt, PA-C      . bisoprolol-hydrochlorothiazide [facility]) 5-6.25 MG per tablet 1 tablet  1 tablet Oral Daily Delray Alt, PA-C   1 tablet at 10/27/18 0959  . buPROPion (WELLBUTRIN XL) 24 hr tablet 300 mg  300 mg Oral q morning - 10a Delray Alt, PA-C   300 mg at 10/27/18 0959  . cefTRIAXone (ROCEPHIN) 1 g in sodium chloride 0.9 % 100 mL IVPB  1 g Intravenous Q24H Patrecia Pace A, PA-C 200  mL/hr at 10/26/18 2112 1 g at 10/26/18 2112  . diphenhydrAMINE (BENADRYL) capsule 25 mg  25 mg Oral Q6H PRN Ulyses SouthwardYacobi, Sarah A, PA-C      . hydrALAZINE (APRESOLINE) injection 5 mg  5 mg Intravenous Q2H PRN Ulyses SouthwardYacobi, Sarah A, PA-C      . lactated ringers infusion   Intravenous Continuous Despina HiddenYacobi, Sarah A, PA-C 10 mL/hr at 10/26/18 1357    . methocarbamol (ROBAXIN) tablet 500 mg  500 mg Oral Q8H PRN Despina HiddenYacobi, Sarah A, PA-C   500 mg at 10/26/18 2113  . morphine 2 MG/ML injection 1 mg  1 mg Intravenous Q4H PRN Despina HiddenYacobi, Sarah A,  PA-C      . multivitamin with minerals tablet 1 tablet  1 tablet Oral Daily Despina HiddenYacobi, Sarah A, PA-C   1 tablet at 10/27/18 0959  . nitroGLYCERIN (NITROSTAT) SL tablet 0.4 mg  0.4 mg Sublingual Q5 Min x 3 PRN Despina HiddenYacobi, Sarah A, PA-C      . ondansetron [facility](ZOFRAN) injection 4 mg  4 mg Intravenous Q8H PRN Despina HiddenYacobi, Sarah A, PA-C      . oxyCODONE-acetaminophen (PERCOCET/ROXICET) 5-325 MG per tablet 1 tablet  1 tablet Oral Q4H PRN Despina HiddenYacobi, Sarah A, PA-C   1 tablet at 10/27/18 0959  . pantoprazole (PROTONIX) EC tablet 40 mg  40 mg Oral Daily Ulyses SouthwardYacobi, Sarah A, PA-C   40 mg at 10/27/18 0959  . pravastatin (PRAVACHOL) tablet 40 mg  40 mg Oral q1800 Ulyses SouthwardYacobi, Sarah A, PA-C      . pregabalin (LYRICA) capsule 75 mg  75 mg Oral BID Ulyses SouthwardYacobi, Sarah A, PA-C   75 mg at 10/27/18 0959  . protein supplement (ENSURE MAX) liquid  11 oz Oral BID Despina HiddenYacobi, Sarah A, PA-C   11 oz at 10/27/18 0959  . senna-docusate (Senokot-S) tablet 1 tablet  1 tablet Oral QHS PRN Despina HiddenYacobi, Sarah A, PA-C      . sertraline (ZOLOFT) tablet 200 mg  200 mg Oral QHS Ulyses SouthwardYacobi, Sarah A, PA-C   200 mg at 10/26/18 2113     Discharge Medications: Please see discharge summary for a list of discharge medications.  Relevant Imaging Results:  Relevant Lab Results:   Additional Information SSN: [id number]  Gildardo GriffesAshley M Eveline Sauve, LCSW

## 2018-10-27 NOTE — Progress Notes (Signed)
Orthopaedic Trauma Progress Note  S: Doing well this morning, having minimal pain. No complaints.   States she lives at home with her husband and child. She ambulates with a walker at baseline. There are steps to get inside her house, but she does have a ramp that she uses with her walker.  O:  Vitals:   10/27/18 0445 10/27/18 0527  BP: 115/69 (!) 129/59  Pulse: 67 (!) 52  Resp: 15 18  Temp: 98.4 F (36.9 C) 98 F (36.7 C)  SpO2: 97% 98%    General - Resting in bed comfortably, NAD Cardiac - Heart RRR Respiratory - No increased work of breathing, lungs CTA anterior lung fields bilaterally Left Lower Extremity - Dressing clean, dry, intact. Small bruise on lateral thigh, noted on previous exam. No/minimal tenderness to palpation of left hip, thigh, knee. Dorsiflexion/planatrflexion intact. Sensation intact to light touch of lower leg and thigh. Able to wiggle all toes. +DP pulse  Imaging: Stable post op imaging.  Labs:  Results for orders placed or performed during the hospital encounter of 10/25/18 (from the past 24 hour(s))  CBC     Status: Abnormal   Collection Time: 10/27/18  5:39 AM  Result Value Ref Range   WBC 10.7 (H) 4.0 - 10.5 K/uL   RBC 3.68 (L) 3.87 - 5.11 MIL/uL   Hemoglobin 10.2 (L) 12.0 - 15.0 g/dL   HCT 33.0 (L) 36.0 - 46.0 %   MCV 89.7 80.0 - 100.0 fL   MCH 27.7 26.0 - 34.0 pg   MCHC 30.9 30.0 - 36.0 g/dL   RDW 15.7 (H) 11.5 - 15.5 %   Platelets 166 150 - 400 K/uL   nRBC 0.0 0.0 - 0.2 %    Assessment: 78 year old female s/p fall  Injuries: Left femoral neck fracture s/p percutaneous fixation  Weightbearing: WBAT LLE  Insicional and dressing care: Dressing can be removed tomorrow and left open to air  Orthopedic device(s):none  CV/Blood loss: Hgb 10.2, stable from prior to surgery. Hemodynamically stable  Pain management: 1. Tylenol 325 mg q 6 hours PRN 2. Robaxin 500 mg q 6 hours PRN 3. Percocet 5/325 mg q 4 hours PRN 4. Lyrica 75 mg BID 5.  Morphine 1 mg q 4 hours PRN  VTE prophylaxis: Okay to restart eliquis today  ID: on Ceftriaxone for UTI, no additional abx needed  Foley/Lines: No foley, KVO IVFs  Medical co-morbidities: A. fib on Eliquis, hyperlipidemia, congestive heart failure, obstructive sleep apnea and chronic kidney disease    Dispo: PT eval today, okay for discharge from ortho standpoint once cleared by medicine team and therapy  Follow - up plan: 2 weeks   Consuelo Thayne A. Carm[facility] ?[phone]? (phone)

## 2018-10-27 NOTE — Progress Notes (Signed)
Pt noted concern of missing 1 ring. Belongings were noted in 2 pink denture containers. Container #1 had upper dentures. Container #2 had only 1 ring. Pt stated" I had 2 rings, I gave it to the girl downstairs before I went to surgery.  I contacted Kira (pacu) with pt concern, missing 1 ring. Vladimir Creeks checked PACU and OR area for item listed above. Stated she "will f/u with dayshift , because same staff returning".  Updated day shift charge and nurse assigned today 5N.

## 2018-10-27 NOTE — Plan of Care (Signed)

## 2018-10-27 NOTE — Progress Notes (Signed)
PROGRESS NOTE    Cindy Gibbs  ZOX:[id number] DOB: May 23, 1940 DOA: 10/25/2018 PCP: Alvera Singh, FNP    Brief Narrative:  78 year old female with history of atrial fibrillation on Eliquis, hyperlipidemia, gastroesophageal reflux disease, congestive heart failure, obstructive sleep apnea, CKD stage III had a fall at home and started to experience left hip pain.  Patient fell about 3 days back, when EMS arrived patient refused transport.  Patient continued to have worsening of the pain, came to the emergency department.  Work-up in the emergency department patient was found to have nondisplaced left femoral neck fracture on the x-ray.  Patient was also found to have urinary tract infection.  Patient had a fever of 100.6, bradycardia.  Plan for percutaneous fixation on 10/27/2018.  Assessment & Plan:   Principal Problem:   Fracture of femoral neck, left, closed ([facility]) Active Problems:   Essential hypertension   GERD (gastroesophageal reflux disease)   Atrial fibrillation (HCC)   Depression   HLD (hyperlipidemia)   UTI (urinary tract infection)   Fall at home, initial encounter   CKD (chronic kidney disease), stage III (HCC)   Chronic diastolic CHF (congestive heart failure) (HCC)   ##Left femoral neck fracture -Appreciate orthopedic surgery follow-up -Continue with the pain management as needed -Patient had a last dose of Eliquis yesterday -Underwent percutaneous fixation of the left femoral neck on 10/26/2018  ##Urinary tract infection -Follow-up with urine cultures-greater than 100,000 colonies of GNR -Continue with Rocephin  ##HTN:  -Continue home medications: Ziac, metoprolol -Which is also on Lasix -IV hydralazine prn  ##GERD (gastroesophageal reflux disease): -Protonix  ##Paroxysmal atrial Fibrillation: -CHA2DS2-VASc Score is 4, needs oral anticoagulation.  -Hold Eliquis for surgery -Hold metoprolol -Patient had 2 episodes of sinus pause of 2.5 seconds when  patient was sleeping, asymptomatic patient also could be having underlying obstructive sleep apnea   ##Depression: -Continue home Wellbutrin  ##HLD (hyperlipidemia): -Pravastatin  ##Fall at home, initial encounter: Patient seems to have had mechanical fall.  CT head and CT of the C-spine is negative for bony fracture. -PT/OT when able to  ##CKD (chronic kidney disease), stage III ([place]):  -Close to baseline.  Baseline creatinine 0.9-1.0.  Her creatinine is 1.16, BUN 25. -Follow-up by BMP  ##Chronic diastolic CHF (congestive heart failure) ([place]):  -2D echo on 03/18/2015 showed EF of 55 to 60%.    Looks euvolemic  -Hold Lasix  ##Elevated LFTs -Most likely from [place]  ##Morbid obesity -Counseling done regarding diet and exercise -Continue with low calorie, high protein diet    Perioperative Cardiac Risk: He has multiple comorbidities, including CHF with EF 55%, A fib, HLD, CKD-III and GERD. Currently patient is independent of her ADLs, IADLs. He is on diuretics, metoprolol and statin at home. No recent acute cardiac issues.  Patient does not have chest pain, shortness of breath, palpitation, leg edema.  No signs of acute CHF exacerbation.  EKG has no acute change. At this time point, no further work up is needed. His GUPTA score perioperative myocardial infarction or cardaic arrest is 0.76 %, which moderate risk. Pt has fever which is likely due to UTI. Start IV rocephin. Need f/u Bx and Ux, make sure she has no bacteremia before surgery.  -    DVT prophylaxis: SCDs Code Status: (Full Family Communication: No family member present  disposition Plan: (SNF  Consultants:   Orthopedic surgery   Antimicrobials:  Rocephin  Subjective: Complaining of left hip pain.  Objective: Vitals:   10/26/18 2012 10/27/18 0445  10/27/18 0527 10/27/18 0747  BP: 129/69 115/69 (!) 129/59 116/87  Pulse: 79 67 (!) 52 63  Resp: 16 15 18 16   Temp: 98.5 F (36.9 C) 98.4 F (36.9 C) 98  F (36.7 C) (!) 97.5 F (36.4 C)  TempSrc: Oral Oral Oral Oral  SpO2: 97% 97% 98% 100%  Weight:      Height:        Intake/Output Summary (Last 24 hours) at 10/27/2018 1501 Last data filed at 10/27/2018 1300 Gross per 24 hour  Intake 1880 ml  Output 1370 ml  Net 510 ml   Filed Weights   10/25/18 1528 10/25/18 2208  Weight: 108 kg 108.4 kg    Examination:  General exam: Appears calm and comfortable  Respiratory system: Clear to auscultation. Respiratory effort normal. Cardiovascular system: S1 & S2 heard, RRR. No JVD, murmurs, rubs, gallops or clicks. No pedal edema. Gastrointestinal system: Abdomen is nondistended, soft and nontender. No organomegaly or masses felt. Normal bowel sounds heard. Central nervous system: Alert and oriented. No focal neurological deficits. Extremities: Left hip in the dressing skin: No rashes, lesions or ulcers Psychiatry: Judgement and insight appear normal. Mood & affect appropriate.     Data Reviewed: I have personally reviewed following labs and imaging studies  CBC: Recent Labs  Lab 10/25/18 1558 10/26/18 0357 10/27/18 0539  WBC 10.1 8.7 10.7*  NEUTROABS 8.1*  --   --   HGB 11.0* 10.1* 10.2*  HCT 36.4 33.3* 33.0*  MCV 90.8 90.0 89.7  PLT 165 144* 166   Basic Metabolic Panel: Recent Labs  Lab 10/25/18 1558 10/26/18 0357  NA 137 137  K 4.5 4.1  CL 102 104  CO2 26 23  GLUCOSE 112* 93  BUN 25* 22  CREATININE 1.16* 1.16*  CALCIUM 9.3 8.8*   GFR: Estimated Creatinine Clearance: 49.6 mL/min (A) (by C-G formula based on SCr of 1.16 mg/dL (H)). Liver Function Tests: Recent Labs  Lab 10/25/18 1558  AST 77*  ALT 73*  ALKPHOS 131*  BILITOT 1.5*  PROT 7.6  ALBUMIN 3.5   No results for input(s): LIPASE, AMYLASE in the last 168 hours. No results for input(s): AMMONIA in the last 168 hours. Coagulation Profile: Recent Labs  Lab 10/25/18 1737  INR 1.2   Cardiac Enzymes: Recent Labs  Lab 10/25/18 1558  CKTOTAL 21*    BNP (last 3 results) No results for input(s): PROBNP in the last 8760 hours. HbA1C: No results for input(s): HGBA1C in the last 72 hours. CBG: No results for input(s): GLUCAP in the last 168 hours. Lipid Profile: No results for input(s): CHOL, HDL, LDLCALC, TRIG, CHOLHDL, LDLDIRECT in the last 72 hours. Thyroid Function Tests: No results for input(s): TSH, T4TOTAL, FREET4, T3FREE, THYROIDAB in the last 72 hours. Anemia Panel: No results for input(s): VITAMINB12, FOLATE, FERRITIN, TIBC, IRON, RETICCTPCT in the last 72 hours. Sepsis Labs: Recent Labs  Lab 10/25/18 1630 10/25/18 2221  LATICACIDVEN 0.9 0.7    Recent Results (from the past 240 hour(s))  SARS Coronavirus 2 (CEPHEID - Performed in [facility] hospital lab), Hosp Order     Status: None   Collection Time: 10/25/18  3:59 PM   Specimen: Nasopharyngeal Swab  Result Value Ref Range Status   SARS Coronavirus 2 NEGATIVE NEGATIVE Final    Comment: (NOTE) If result is NEGATIVE SARS-CoV-2 target nucleic acids are NOT DETECTED. The SARS-CoV-2 RNA is generally detectable in upper and lower  respiratory specimens during the acute phase of infection. The lowest  concentration of SARS-CoV-2 viral copies this assay can detect is 250  copies / mL. A negative result does not preclude SARS-CoV-2 infection  and should not be used as the sole basis for treatment or other  patient management decisions.  A negative result may occur with  improper specimen collection / handling, submission of specimen other  than nasopharyngeal swab, presence of viral mutation(s) within the  areas targeted by this assay, and inadequate number of viral copies  (<250 copies / mL). A negative result must be combined with clinical  observations, patient history, and epidemiological information. If result is POSITIVE SARS-CoV-2 target nucleic acids are DETECTED. The SARS-CoV-2 RNA is generally detectable in upper and lower  respiratory specimens dur ing  the acute phase of infection.  Positive  results are indicative of active infection with SARS-CoV-2.  Clinical  correlation with patient history and other diagnostic information is  necessary to determine patient infection status.  Positive results do  not rule out bacterial infection or co-infection with other viruses. If result is PRESUMPTIVE POSTIVE SARS-CoV-2 nucleic acids MAY BE PRESENT.   A presumptive positive result was obtained on the submitted specimen  and confirmed on repeat testing.  While 2019 novel coronavirus  (SARS-CoV-2) nucleic acids may be present in the submitted sample  additional confirmatory testing may be necessary for epidemiological  and / or clinical management purposes  to differentiate between  SARS-CoV-2 and other Sarbecovirus currently known to infect humans.  If clinically indicated additional testing with an alternate test  methodology [phone]) is advised. The SARS-CoV-2 RNA is generally  detectable in upper and lower respiratory sp ecimens during the acute  phase of infection. The expected result is Negative. Fact Sheet for Patients:  BoilerBrush.com.cy Fact Sheet for Healthcare Providers: https://pope.com/ This test is not yet approved or cleared by the [place] FDA and has been authorized for detection and/or diagnosis of SARS-CoV-2 by FDA under an Emergency Use Authorization (EUA).  This EUA will remain in effect (meaning this test can be used) for the duration of the COVID-19 declaration under Section 564(b)(1) of the Act, 21 U.S.C. section 360bbb-3(b)(1), unless the authorization is terminated or revoked sooner. Performed at [facility], [address], [place], [place] [address]   Blood culture (routine x 2)     Status: None (Preliminary result)   Collection Time: 10/25/18  5:37 PM   Specimen: BLOOD  Result Value Ref Range Status   Specimen Description BLOOD BLOOD RIGHT HAND  Final    Special Requests   Final    BOTTLES DRAWN AEROBIC AND ANAEROBIC Blood Culture adequate volume   Culture   Final    NO GROWTH 2 DAYS Performed at [facility], [address], [place], [place] [address]    Report Status PENDING  Incomplete  Blood culture (routine x 2)     Status: None (Preliminary result)   Collection Time: 10/25/18  5:37 PM   Specimen: BLOOD  Result Value Ref Range Status   Specimen Description BLOOD RIGHT ANTECUBITAL  Final   Special Requests   Final    BOTTLES DRAWN AEROBIC AND ANAEROBIC Blood Culture adequate volume   Culture   Final    NO GROWTH 2 DAYS Performed at [facility], [address] [place], [place] [address]    Report Status PENDING  Incomplete  Urine culture     Status: Abnormal (Preliminary result)   Collection Time: 10/25/18  7:23 PM   Specimen: Urine,  Random  Result Value Ref Range Status   Specimen Description URINE, RANDOM  Final   Special Requests NONE  Final   Culture (A)  Final    >=100,000 COLONIES/mL GRAM NEGATIVE RODS CULTURE REINCUBATED FOR BETTER GROWTH Performed at [facility], [address] [place], [place] [address]    Report Status PENDING  Incomplete  Surgical PCR screen     Status: Abnormal   Collection Time: 10/25/18 11:45 PM   Specimen: Nasal Mucosa; Nasal Swab  Result Value Ref Range Status   MRSA, PCR NEGATIVE NEGATIVE Final   Staphylococcus aureus POSITIVE (A) NEGATIVE Final    Comment: (NOTE) The Xpert SA Assay (FDA approved for NASAL specimens in patients 10622 years of age and older), is one component of a comprehensive surveillance program. It is not intended to diagnose infection nor to guide or monitor treatment. Performed at [facility], [address] [place], [place] [address]          Radiology Studies: Dg Chest 1 View  Result Date: 10/25/2018 CLINICAL DATA:  Larey SeatFell 1-2 days ago. Leg and back pain. Left femoral neck fracture. EXAM: CHEST  1 VIEW COMPARISON:  05/08/2018  FINDINGS: Cardiac silhouette is top-normal in size. No mediastinal or hilar masses. No convincing adenopathy. Clear lungs.  No pleural effusion or pneumothorax. Skeletal structures are demineralized but grossly intact. IMPRESSION: No acute cardiopulmonary disease. Electronically Signed   By: Amie Portlandavid  Ormond M.D.   On: 10/25/2018 17:50   Dg Pelvis 1-2 Views  Result Date: 10/25/2018 CLINICAL DATA:  Larey SeatFell 1 or 2 days ago.  Right leg pain. EXAM: PELVIS - 1-2 VIEW COMPARISON:  Abdomen and pelvis CT, 08/19/2008 FINDINGS: There is a non comminuted, subcapital fracture of the left femoral neck, without significant displacement but with valgus angulation. No other fracture.  No bone lesion. Hip joints, SI joints and symphysis pubis are normally aligned. Skeletal structures are diffusely demineralized. They are soft tissue edema adjacent to the fractured left femoral neck. IMPRESSION: 1. Fracture of the left femoral neck as described. No dislocation. No other acute skeletal abnormality. Electronically Signed   By: Amie Portlandavid  Ormond M.D.   On: 10/25/2018 17:44   Dg Tibia/fibula Left  Result Date: 10/25/2018 CLINICAL DATA:  Fall 1-2 days ago.  Left leg pain. EXAM: LEFT TIBIA AND FIBULA - 2 VIEW COMPARISON:  None. FINDINGS: No fracture or bone lesion. Advanced degenerative changes noted of the knee. Knee joint normally aligned. Ankle joint is normally aligned. Skeletal structures are diffusely demineralized. Soft tissues are unremarkable. IMPRESSION: No fracture or dislocation. Electronically Signed   By: Amie Portlandavid  Ormond M.D.   On: 10/25/2018 17:46   Dg Tibia/fibula Right  Result Date: 10/25/2018 CLINICAL DATA:  Fall 1 or 2 days ago.  Right leg pain. EXAM: RIGHT TIBIA AND FIBULA - 2 VIEW COMPARISON:  None. FINDINGS: No fracture.  No bone lesion.  Bones are diffusely demineralized. There are advanced degenerative changes of the knee. Knee joint is normally aligned. Ankle joint is normally aligned. Soft tissues are  unremarkable. IMPRESSION: No fracture or dislocation Electronically Signed   By: Amie Portlandavid  Ormond M.D.   On: 10/25/2018 17:45   Ct Head Wo Contrast  Result Date: 10/25/2018 CLINICAL DATA:  Per EMS pt fell on Wednesday EMS was dispatched and pt refused transport. Upon waking this AM pt experiencing pain in her left hip and leg. Pt has been able to walk since fall until today EXAM: CT HEAD WITHOUT CONTRAST CT CERVICAL SPINE  WITHOUT CONTRAST TECHNIQUE: Multidetector CT imaging of the head and cervical spine was performed following the standard protocol without intravenous contrast. Multiplanar CT image reconstructions of the cervical spine were also generated. COMPARISON:  Head CT, 03/16/2015.  Brain MRI, 03/17/2015. FINDINGS: CT HEAD FINDINGS Brain: No evidence of acute infarction, hemorrhage, hydrocephalus, extra-axial collection or mass lesion/mass effect. Mild patchy areas of subcortical and periventricular white matter hypoattenuation consistent with chronic microvascular ischemic change. Vascular: No hyperdense vessel or unexpected calcification. Skull: Normal. Negative for fracture or focal lesion. Sinuses/Orbits: Globes and orbits are unremarkable. Sinuses and mastoid air cells are clear. Other: None. CT CERVICAL SPINE FINDINGS Alignment: Normal. Skull base and vertebrae: No acute fracture. No primary bone lesion or focal pathologic process. Soft tissues and spinal canal: No prevertebral fluid or swelling. No visible canal hematoma. Heterogeneous appearance of the thyroid suggesting bilateral poorly defined nodules. Measure lies esophagus is dilated. No neck masses or enlarged lymph nodes. Carotid vascular calcifications. Disc levels: Mild loss of disc height at C2-C3 and C3-C4. Moderate to marked loss of disc height at C4-C5 through C7-T1 with mild endplate sclerosis and endplate osteophytes. No convincing disc herniation. Upper chest: There is interstitial thickening with mild interval ground-glass opacities  at the lung apices, right greater than left. This may be chronic. Consider congestive heart failure if there are consistent clinical findings. Infection is not excluded. Other: None. IMPRESSION: HEAD CT 1. No acute intracranial abnormalities. 2. No skull fracture. CERVICAL CT 1. No fracture or acute finding. 2. Degenerative changes as detailed. 3. Interstitial thickening and intervening areas of ground-glass opacity at the lung apices, right greater than left. This is consistent with interstitial airspace edema from congestive heart failure in the proper clinical setting. Infection or inflammation could have this appearance. Findings could be chronic. Electronically Signed   By: Amie Portland M.D.   On: 10/25/2018 18:37   Ct Cervical Spine Wo Contrast  Result Date: 10/25/2018 CLINICAL DATA:  Per EMS pt fell on Wednesday EMS was dispatched and pt refused transport. Upon waking this AM pt experiencing pain in her left hip and leg. Pt has been able to walk since fall until today EXAM: CT HEAD WITHOUT CONTRAST CT CERVICAL SPINE WITHOUT CONTRAST TECHNIQUE: Multidetector CT imaging of the head and cervical spine was performed following the standard protocol without intravenous contrast. Multiplanar CT image reconstructions of the cervical spine were also generated. COMPARISON:  Head CT, 03/16/2015.  Brain MRI, 03/17/2015. FINDINGS: CT HEAD FINDINGS Brain: No evidence of acute infarction, hemorrhage, hydrocephalus, extra-axial collection or mass lesion/mass effect. Mild patchy areas of subcortical and periventricular white matter hypoattenuation consistent with chronic microvascular ischemic change. Vascular: No hyperdense vessel or unexpected calcification. Skull: Normal. Negative for fracture or focal lesion. Sinuses/Orbits: Globes and orbits are unremarkable. Sinuses and mastoid air cells are clear. Other: None. CT CERVICAL SPINE FINDINGS Alignment: Normal. Skull base and vertebrae: No acute fracture. No primary  bone lesion or focal pathologic process. Soft tissues and spinal canal: No prevertebral fluid or swelling. No visible canal hematoma. Heterogeneous appearance of the thyroid suggesting bilateral poorly defined nodules. Measure lies esophagus is dilated. No neck masses or enlarged lymph nodes. Carotid vascular calcifications. Disc levels: Mild loss of disc height at C2-C3 and C3-C4. Moderate to marked loss of disc height at C4-C5 through C7-T1 with mild endplate sclerosis and endplate osteophytes. No convincing disc herniation. Upper chest: There is interstitial thickening with mild interval ground-glass opacities at the lung apices, right greater than left. This may be  chronic. Consider congestive heart failure if there are consistent clinical findings. Infection is not excluded. Other: None. IMPRESSION: HEAD CT 1. No acute intracranial abnormalities. 2. No skull fracture. CERVICAL CT 1. No fracture or acute finding. 2. Degenerative changes as detailed. 3. Interstitial thickening and intervening areas of ground-glass opacity at the lung apices, right greater than left. This is consistent with interstitial airspace edema from congestive heart failure in the proper clinical setting. Infection or inflammation could have this appearance. Findings could be chronic. Electronically Signed   By: Amie Portlandavid  Ormond M.D.   On: 10/25/2018 18:37   Ct Hip Left Wo Contrast  Result Date: 10/25/2018 CLINICAL DATA:  Left hip fracture. EXAM: CT OF THE LEFT HIP WITHOUT CONTRAST TECHNIQUE: Multidetector CT imaging of the left hip was performed according to the standard protocol. Multiplanar CT image reconstructions were also generated. COMPARISON:  Radiographs from 10/25/2018 FINDINGS: Bones/Joint/Cartilage Subcapital left femoral neck fracture noted with mild lateral impaction and mild lateral cortical discontinuity. The adjacent portion of the pelvis appears normal. Little if any hip joint effusion. Ligaments Suboptimally assessed by  CT. Muscles and Tendons Atrophic gluteus minimus muscle, not uncommon in this age group. Soft tissues Sigmoid colon diverticulosis. Left common femoral artery atherosclerotic calcification. IMPRESSION: 1. Subcapital left femoral neck fracture noted with mild lateral impaction. 2. Sigmoid colon diverticulosis. 3. Left common femoral artery atherosclerotic calcification. Electronically Signed   By: Gaylyn RongWalter  Liebkemann M.D.   On: 10/25/2018 21:00   Dg C-arm 1-60 Min  Result Date: 10/26/2018 CLINICAL DATA:  Repair of left hip fracture. FLUOROSCOPY TIME:  1 minutes 42 seconds. Images: 7 EXAM: OPERATIVE LEFT HIP (WITH PELVIS IF PERFORMED) 7 VIEWS TECHNIQUE: Fluoroscopic spot image(s) were submitted for interpretation post-operatively. COMPARISON:  None. FINDINGS: Three screws were placed across the left hip fracture throughout the study. IMPRESSION: Repair of left hip fracture with 3 screws. Electronically Signed   By: Gerome Samavid  Williams III M.D   On: 10/26/2018 17:45   Dg Hip Operative Unilat W Or W/o Pelvis Left  Result Date: 10/26/2018 CLINICAL DATA:  Repair of left hip fracture. FLUOROSCOPY TIME:  1 minutes 42 seconds. Images: 7 EXAM: OPERATIVE LEFT HIP (WITH PELVIS IF PERFORMED) 7 VIEWS TECHNIQUE: Fluoroscopic spot image(s) were submitted for interpretation post-operatively. COMPARISON:  None. FINDINGS: Three screws were placed across the left hip fracture throughout the study. IMPRESSION: Repair of left hip fracture with 3 screws. Electronically Signed   By: Gerome Samavid  Williams III M.D   On: 10/26/2018 17:45   Dg Hip Unilat W Or W/o Pelvis 2-3 Views Left  Result Date: 10/26/2018 CLINICAL DATA:  Status post hip fracture repair. EXAM: DG HIP (WITH OR WITHOUT PELVIS) 2-3V LEFT COMPARISON:  None. FINDINGS: Three surgical screws have been utilized to repair the patient's left hip fracture. No other abnormalities. Hardware is in good position. IMPRESSION: Repair of left hip fracture. Electronically Signed   By: Gerome Samavid   Williams III M.D   On: 10/26/2018 17:45   Dg Femur Min 2 Views Left  Result Date: 10/25/2018 CLINICAL DATA:  Fall leg pain.  1-2 days ago.  Left EXAM: LEFT FEMUR 2 VIEWS COMPARISON:  None. FINDINGS: There is a subcapital fracture of the left femoral neck. No fracture comminution or significant displacement. There is valgus angulation. No other fractures. No bone lesions. Skeletal structures are demineralized. Hip and knee joints are normally aligned. There are advanced degenerative changes of the left knee joint. Small knee joint effusion. IMPRESSION: 1. Nondisplaced, non comminuted subcapital fracture of the  left femoral neck with valgus angulation. 2. No other fractures.  No dislocation Electronically Signed   By: Amie Portland M.D.   On: 10/25/2018 17:49   Dg Femur Min 2 Views Right  Result Date: 10/25/2018 CLINICAL DATA:  Larey Seat 1-2 days ago.  Right leg pain. EXAM: RIGHT FEMUR 2 VIEWS COMPARISON:  None. FINDINGS: No fracture. No bone lesion. The skeletal structures are diffusely demineralized. Right hip joint normally spaced and aligned. Knee joint normally aligned with advanced degenerative changes. Soft tissues are unremarkable. IMPRESSION: No fracture or dislocation. Electronically Signed   By: Amie Portland M.D.   On: 10/25/2018 17:47        Scheduled Meds:  bisoprolol-hydrochlorothiazide  1 tablet Oral Daily   buPROPion  300 mg Oral q morning - 10a   multivitamin with minerals  1 tablet Oral Daily   pantoprazole  40 mg Oral Daily   pravastatin  40 mg Oral q1800   pregabalin  75 mg Oral BID   Ensure Max Protein  11 oz Oral BID   sertraline  200 mg Oral QHS   Continuous Infusions:  cefTRIAXone (ROCEPHIN)  IV 1 g (10/26/18 2112)   lactated ringers 10 mL/hr at 10/26/18 1357     LOS: 2 days    Time spent: 35 minutes    Deandra Gadson, MD [facility] Pager [phone]  If 7PM-7AM, please contact night-coverage [email or web address] Password [facility] 10/27/2018,  3:01 PM

## 2018-10-28 ENCOUNTER — Encounter (HOSPITAL_COMMUNITY): Payer: Self-pay | Admitting: Student

## 2018-10-28 MED ORDER — APIXABAN 5 MG PO TABS
5.0000 mg | ORAL_TABLET | Freq: Two times a day (BID) | ORAL | Status: DC
  Administered 2018-10-28 – 2018-10-30 (×4): 5 mg via ORAL
  Filled 2018-10-28 (×4): qty 1

## 2018-10-28 MED ORDER — CHLORHEXIDINE GLUCONATE CLOTH 2 % EX PADS
6.0000 | MEDICATED_PAD | Freq: Every day | CUTANEOUS | Status: DC
  Administered 2018-10-28 – 2018-10-30 (×3): 6 via TOPICAL

## 2018-10-28 MED ORDER — MUPIROCIN 2 % EX OINT
1.0000 "application " | TOPICAL_OINTMENT | Freq: Two times a day (BID) | CUTANEOUS | Status: DC
  Administered 2018-10-28 – 2018-10-30 (×5): 1 via NASAL
  Filled 2018-10-28: qty 22

## 2018-10-28 NOTE — Care Management Important Message (Signed)
Important Message  Patient Details  Name: Cindy Gibbs MRN: [id number] Date of Birth: [birth year]   Medicare Important Message Given:  Yes    Memory Argue 10/28/2018, 3:55 PM

## 2018-10-28 NOTE — Progress Notes (Signed)
Physical Therapy Treatment Patient Details Name: Cindy Gibbs MRN: [id number] DOB: 30-Nov-1940 Today's Date: 10/28/2018    History of Present Illness Pt is a 78 y.o. female with medical history significant of A. fib on Eliquis, hyperlipidemia, GERD, CHF, OSA, and CKD-3. She presented to the ED following a fall and left hip pain. Xray revealed L hip fx. She underwent ORIF 10/26/18.    PT Comments    Pt with improved ability to move L LE in bed. Pt remains unable to stand but did complete lateral transfer with slide board and maxAx2 from EOB to chair. Pt very appreciative for helping her transfer OOB. RN tech educated on how to use slide board and proper placement, tech with understanding. Acute PT to cont to follow. Con't to recommend ST-SNF upon d/c.    Follow Up Recommendations  SNF;Supervision/Assistance - 24 hour     Equipment Recommendations  None recommended by PT    Recommendations for Other Services       Precautions / Restrictions Precautions Precautions: Fall Restrictions Weight Bearing Restrictions: Yes LLE Weight Bearing: Weight bearing as tolerated    Mobility  Bed Mobility Overal bed mobility: Needs Assistance Bed Mobility: Supine to Sit     Supine to sit: Mod assist;HOB elevated Sit to supine: Mod assist;HOB elevated   General bed mobility comments: minA for L LE management, pt able to initiated ABduction to EOB with L LE, modA for trunk elevation and to scoot to EOB  Transfers Overall transfer level: Needs assistance Equipment used: Sliding board Transfers: Lateral/Scoot Transfers          Lateral/Scoot Transfers: Max assist;+2 physical assistance;With slide board General transfer comment: attempted to lateral scoot however due to body habitus pt unable to use arms functionally to assist with transfer more than 3 scoots. Used slide board and pt able to complete lateral scoot with maxAx2. Educated Engineer, manufacturing on how to use [employer] with the drop arm  chair  Ambulation/Gait             General Gait Details: unable   Marine scientist Rank[place] (Stroke Patients Only)       Balance Overall balance assessment: Needs assistance Sitting-balance support: Single extremity supported;Feet supported Sitting balance-Leahy Scale: Fair Sitting balance - Comments: with onset of fatigue pt with posterior lean, tolerated EOB sitting x 10 min and complete LE ther ex                                    Cognition Arousal/Alertness: Awake/alert Behavior During Therapy: WFL for tasks assessed/performed Overall Cognitive Status: Within Functional Limits for tasks assessed                                        Exercises General Exercises - Lower Extremity Ankle Circles/Pumps: AROM;Both;10 reps;Seated Quad Sets: AROM;Left;10 reps;Supine(very weak) Long Arc Quad: AROM;Both;10 reps;Seated    General Comments General comments (skin integrity, edema, etc.): VSS      Pertinent Vitals/Pain Pain Assessment: 0-10 Pain Score: 5  Pain Location: L hip Pain Descriptors / Indicators: Grimacing;Operative site guarding;Sore Pain Intervention(s): Monitored during session    Home Living  Prior Function            PT Goals (current goals can now be found in the care plan section) Acute Rehab PT Goals Patient Stated Goal: walker Progress towards PT goals: Progressing toward goals    Frequency    Min 3X/week      PT Plan Current plan remains appropriate    Co-evaluation              AM-PAC PT "6 Clicks" Mobility   Outcome Measure  Help needed turning from your back to your side while in a flat bed without using bedrails?: A Lot Help needed moving from lying on your back to sitting on the side of a flat bed without using bedrails?: A Lot Help needed moving to and from a bed to a chair (including a wheelchair)?: A Lot Help  needed standing up from a chair using your arms (e.g., wheelchair or bedside chair)?: A Lot Help needed to walk in hospital room?: Total Help needed climbing 3-5 steps with a railing? : Total 6 Click Score: 10    End of Session Equipment Utilized During Treatment: Oxygen;Gait belt Activity Tolerance: Patient limited by pain Patient left: in chair;with call bell/phone within reach;with chair alarm set;with family/visitor present Nurse Communication: Mobility status PT Visit Diagnosis: Other abnormalities of gait and mobility (R26.89);Pain;Muscle weakness (generalized) (M62.81) Pain - Right/Left: Left Pain - part of body: Hip     Time: [id number] PT Time Calculation (min) (ACUTE ONLY): 36 min  Charges:  $Therapeutic Exercise: 8-22 mins $Therapeutic Activity: 8-22 mins                     Lewis ShockAshly Mariama Saintvil, PT, DPT Acute Rehabilitation Services Pager #: [phone] Office #: [phone]    Iona Hansenshly M Toniann Dickerson 10/28/2018, 12:03 PM

## 2018-10-28 NOTE — Progress Notes (Signed)
PROGRESS NOTE    Cindy CaffeyBarbara C Gibbs  UJW:[id number] DOB: August 13, 1940 DOA: 10/25/2018 PCP: Tacey Ruizolleschel, Emily, FNP    Brief Narrative:  78 year old female with history of atrial fibrillation on Eliquis, hyperlipidemia, gastroesophageal reflux disease, congestive heart failure, obstructive sleep apnea, CKD stage III had a fall at home and started to experience left hip pain.  Patient fell about 3 days back, when EMS arrived patient refused transport.  Patient continued to have worsening of the pain, came to the emergency department.  Work-up in the emergency department patient was found to have nondisplaced left femoral neck fracture on the x-ray.  Patient was also found to have urinary tract infection.  Patient had a fever of 100.6, bradycardia.  Plan for percutaneous fixation on 10/27/2018.  Assessment & Plan:   Principal Problem:   Fracture of femoral neck, left, closed (HCC) Active Problems:   Essential hypertension   GERD (gastroesophageal reflux disease)   Atrial fibrillation (HCC)   Depression   HLD (hyperlipidemia)   UTI (urinary tract infection)   Fall at home, initial encounter   CKD (chronic kidney disease), stage III (HCC)   Chronic diastolic CHF (congestive heart failure) (HCC)   ##Left femoral neck fracture -Appreciate orthopedic surgery follow-up -Continue with the pain management as needed -Started back on Eliquis -Underwent percutaneous fixation of the left femoral neck on 10/26/2018 -Evaluated by PT, recommended SNF  ##Urinary tract infection -Follow-up with urine cultures-greater than 100,000 colonies of E. coli.  Final sensitivities are pending -Continue with Rocephin  ##HTN:  -Continue home medications: Ziac, metoprolol -Which is also on Lasix -IV hydralazine prn  ##GERD (gastroesophageal reflux disease): -Protonix  ##Paroxysmal atrial Fibrillation: -CHA2DS2-VASc Score is 4, needs oral anticoagulation.  -Hold Eliquis for surgery -Hold metoprolol -Patient  had 2 episodes of sinus pause of 2.5 seconds when patient was sleeping, asymptomatic patient also could be having underlying obstructive sleep apnea   ##Depression: -Continue home Wellbutrin  ##HLD (hyperlipidemia): -Pravastatin  ##Fall at home, initial encounter: Patient seems to have had mechanical fall.  CT head and CT of the C-spine is negative for bony fracture. -PT/OT when able to  ##CKD (chronic kidney disease), stage III (HCC):  -Close to baseline.  Baseline creatinine 0.9-1.0.  Her creatinine is 1.16, BUN 25. -Follow-up by BMP  ##Chronic diastolic CHF (congestive heart failure) (HCC):  -2D echo on 03/18/2015 showed EF of 55 to 60%.    Looks euvolemic  -Hold Lasix  ##Elevated LFTs -Most likely from [place]  ##Morbid obesity -Counseling done regarding diet and exercise -Continue with low calorie, high protein diet    DVT prophylaxis: SCDs Code Status: (Full Family Communication: No family member present  disposition Plan: (SNF  Consultants:   Orthopedic surgery   Antimicrobials:  Rocephin 10/26/2018  Subjective: Complaining of left hip pain.  Objective: Vitals:   10/27/18 2147 10/28/18 0345 10/28/18 0746 10/28/18 0941  BP: (!) 91/59 111/65 (!) 84/55 115/65  Pulse: 63 80 61 77  Resp: 18  16   Temp: 98.4 F (36.9 C) 98 F (36.7 C) 97.9 F (36.6 C)   TempSrc: Oral Oral Oral   SpO2: 99% 94% 97% 92%  Weight:      Height:        Intake/Output Summary (Last 24 hours) at 10/28/2018 1315 Last data filed at 10/28/2018 0900 Gross per 24 hour  Intake 360 ml  Output 1100 ml  Net -740 ml   Filed Weights   10/25/18 1528 10/25/18 2208  Weight: 108 kg 108.4 kg  Examination:  General exam: Appears calm and comfortable  Respiratory system: Clear to auscultation. Respiratory effort normal. Cardiovascular system: S1 & S2 heard, RRR. No JVD, murmurs, rubs, gallops or clicks. No pedal edema. Gastrointestinal system: Abdomen is nondistended, soft and  nontender. No organomegaly or masses felt. Normal bowel sounds heard. Central nervous system: Alert and oriented. No focal neurological deficits. Extremities: Left hip in the dressing skin: No rashes, lesions or ulcers Psychiatry: Judgement and insight appear normal. Mood & affect appropriate.     Data Reviewed: I have personally reviewed following labs and imaging studies  CBC: Recent Labs  Lab 10/25/18 1558 10/26/18 0357 10/27/18 0539  WBC 10.1 8.7 10.7*  NEUTROABS 8.1*  --   --   HGB 11.0* 10.1* 10.2*  HCT 36.4 33.3* 33.0*  MCV 90.8 90.0 89.7  PLT 165 144* 166   Basic Metabolic Panel: Recent Labs  Lab 10/25/18 1558 10/26/18 0357  NA 137 137  K 4.5 4.1  CL 102 104  CO2 26 23  GLUCOSE 112* 93  BUN 25* 22  CREATININE 1.16* 1.16*  CALCIUM 9.3 8.8*   GFR: Estimated Creatinine Clearance: 49.6 mL/min (A) (by C-G formula based on SCr of 1.16 mg/dL (H)). Liver Function Tests: Recent Labs  Lab 10/25/18 1558  AST 77*  ALT 73*  ALKPHOS 131*  BILITOT 1.5*  PROT 7.6  ALBUMIN 3.5   No results for input(s): LIPASE, AMYLASE in the last 168 hours. No results for input(s): AMMONIA in the last 168 hours. Coagulation Profile: Recent Labs  Lab 10/25/18 1737  INR 1.2   Cardiac Enzymes: Recent Labs  Lab 10/25/18 1558  CKTOTAL 21*   BNP (last 3 results) No results for input(s): PROBNP in the last 8760 hours. HbA1C: No results for input(s): HGBA1C in the last 72 hours. CBG: No results for input(s): GLUCAP in the last 168 hours. Lipid Profile: No results for input(s): CHOL, HDL, LDLCALC, TRIG, CHOLHDL, LDLDIRECT in the last 72 hours. Thyroid Function Tests: No results for input(s): TSH, T4TOTAL, FREET4, T3FREE, THYROIDAB in the last 72 hours. Anemia Panel: No results for input(s): VITAMINB12, FOLATE, FERRITIN, TIBC, IRON, RETICCTPCT in the last 72 hours. Sepsis Labs: Recent Labs  Lab 10/25/18 1630 10/25/18 2221  LATICACIDVEN 0.9 0.7    Recent Results (from  the past 240 hour(s))  SARS Coronavirus 2 (CEPHEID - Performed in [facility] lab), Hosp Order     Status: None   Collection Time: 10/25/18  3:59 PM   Specimen: Nasopharyngeal Swab  Result Value Ref Range Status   SARS Coronavirus 2 NEGATIVE NEGATIVE Final    Comment: (NOTE) If result is NEGATIVE SARS-CoV-2 target nucleic acids are NOT DETECTED. The SARS-CoV-2 RNA is generally detectable in upper and lower  respiratory specimens during the acute phase of infection. The lowest  concentration of SARS-CoV-2 viral copies this assay can detect is 250  copies / mL. A negative result does not preclude SARS-CoV-2 infection  and should not be used as the sole basis for treatment or other  patient management decisions.  A negative result may occur with  improper specimen collection / handling, submission of specimen other  than nasopharyngeal swab, presence of viral mutation(s) within the  areas targeted by this assay, and inadequate number of viral copies  (<250 copies / mL). A negative result must be combined with clinical  observations, patient history, and epidemiological information. If result is POSITIVE SARS-CoV-2 target nucleic acids are DETECTED. The SARS-CoV-2 RNA is generally detectable in upper  and lower  respiratory specimens dur ing the acute phase of infection.  Positive  results are indicative of active infection with SARS-CoV-2.  Clinical  correlation with patient history and other diagnostic information is  necessary to determine patient infection status.  Positive results do  not rule out bacterial infection or co-infection with other viruses. If result is PRESUMPTIVE POSTIVE SARS-CoV-2 nucleic acids MAY BE PRESENT.   A presumptive positive result was obtained on the submitted specimen  and confirmed on repeat testing.  While 2019 novel coronavirus  (SARS-CoV-2) nucleic acids may be present in the submitted sample  additional confirmatory testing may be necessary  for epidemiological  and / or clinical management purposes  to differentiate between  SARS-CoV-2 and other Sarbecovirus currently known to infect humans.  If clinically indicated additional testing with an alternate test  methodology [phone]) is advised. The SARS-CoV-2 RNA is generally  detectable in upper and lower respiratory sp ecimens during the acute  phase of infection. The expected result is Negative. Fact Sheet for Patients:  StrictlyIdeas.no Fact Sheet for Healthcare Providers: BankingDealers.co.za This test is not yet approved or cleared by the [place] FDA and has been authorized for detection and/or diagnosis of SARS-CoV-2 by FDA under an Emergency Use Authorization (EUA).  This EUA will remain in effect (meaning this test can be used) for the duration of the COVID-19 declaration under Section 564(b)(1) of the Act, 21 U.S.C. section 360bbb-3(b)(1), unless the authorization is terminated or revoked sooner. Performed at [facility], [place] [address] [place], [place] [address]   Blood culture (routine x 2)     Status: None (Preliminary result)   Collection Time: 10/25/18  5:37 PM   Specimen: BLOOD  Result Value Ref Range Status   Specimen Description BLOOD BLOOD RIGHT HAND  Final   Special Requests   Final    BOTTLES DRAWN AEROBIC AND ANAEROBIC Blood Culture adequate volume   Culture   Final    NO GROWTH 2 DAYS Performed at [facility], [place] [address] [place], [place] [address]    Report Status PENDING  Incomplete  Blood culture (routine x 2)     Status: None (Preliminary result)   Collection Time: 10/25/18  5:37 PM   Specimen: BLOOD  Result Value Ref Range Status   Specimen Description BLOOD RIGHT ANTECUBITAL  Final   Special Requests   Final    BOTTLES DRAWN AEROBIC AND ANAEROBIC Blood Culture adequate volume   Culture   Final    NO GROWTH 2 DAYS Performed at [facility], [place] [address], [place], [place] [address]    Report Status PENDING  Incomplete  Urine culture     Status: Abnormal (Preliminary result)   Collection Time: 10/25/18  7:23 PM   Specimen: Urine, Random  Result Value Ref Range Status   Specimen Description URINE, RANDOM  Final   Special Requests NONE  Final   Culture (A)  Final    >=100,000 COLONIES/mL ESCHERICHIA COLI CULTURE REINCUBATED FOR BETTER GROWTH Performed at [facility], [place] [address], [place], [place] [address]    Report Status PENDING  Incomplete  Surgical PCR screen     Status: Abnormal   Collection Time: 10/25/18 11:45 PM   Specimen: Nasal Mucosa; Nasal Swab  Result Value Ref Range Status   MRSA, PCR NEGATIVE NEGATIVE Final   Staphylococcus aureus POSITIVE (A) NEGATIVE Final    Comment: (NOTE) The Xpert SA Assay (FDA approved for  NASAL specimens in patients 76 years of age and older), is one component of a comprehensive surveillance program. It is not intended to diagnose infection nor to guide or monitor treatment. Performed at [facility], [address] [place], [place] [address]          Radiology Studies: Dg C-arm 1-60 Min  Result Date: 10/26/2018 CLINICAL DATA:  Repair of left hip fracture. FLUOROSCOPY TIME:  1 minutes 42 seconds. Images: 7 EXAM: OPERATIVE LEFT HIP (WITH PELVIS IF PERFORMED) 7 VIEWS TECHNIQUE: Fluoroscopic spot image(s) were submitted for interpretation post-operatively. COMPARISON:  None. FINDINGS: Three screws were placed across the left hip fracture throughout the study. IMPRESSION: Repair of left hip fracture with 3 screws. Electronically Signed   By: Gerome Samavid  Williams III M.D   On: 10/26/2018 17:45   Dg Hip Operative Unilat W Or W/o Pelvis Left  Result Date: 10/26/2018 CLINICAL DATA:  Repair of left hip fracture. FLUOROSCOPY TIME:  1 minutes 42 seconds. Images: 7 EXAM: OPERATIVE LEFT HIP (WITH PELVIS IF PERFORMED) 7 VIEWS TECHNIQUE: Fluoroscopic spot image(s) were submitted for  interpretation post-operatively. COMPARISON:  None. FINDINGS: Three screws were placed across the left hip fracture throughout the study. IMPRESSION: Repair of left hip fracture with 3 screws. Electronically Signed   By: Gerome Samavid  Williams III M.D   On: 10/26/2018 17:45   Dg Hip Unilat W Or W/o Pelvis 2-3 Views Left  Result Date: 10/26/2018 CLINICAL DATA:  Status post hip fracture repair. EXAM: DG HIP (WITH OR WITHOUT PELVIS) 2-3V LEFT COMPARISON:  None. FINDINGS: Three surgical screws have been utilized to repair the patient's left hip fracture. No other abnormalities. Hardware is in good position. IMPRESSION: Repair of left hip fracture. Electronically Signed   By: Gerome Samavid  Williams III M.D   On: 10/26/2018 17:45        Scheduled Meds: . bisoprolol-hydrochlorothiazide  1 tablet Oral Daily  . buPROPion  300 mg Oral q morning - 10a  . Chlorhexidine Gluconate Cloth  6 each Topical Daily  . multivitamin with minerals  1 tablet Oral Daily  . mupirocin ointment  1 application Nasal BID  . pantoprazole  40 mg Oral Daily  . pravastatin  40 mg Oral q1800  . pregabalin  75 mg Oral BID  . Ensure Max Protein  11 oz Oral BID  . sertraline  200 mg Oral QHS   Continuous Infusions: . cefTRIAXone (ROCEPHIN)  IV 1 g (10/27/18 2256)  . lactated ringers 10 mL/hr at 10/26/18 1357     LOS: 3 days    Time spent: 35 minutes    Matteo Banke, MD [facility] Pager [phone]  If 7PM-7AM, please contact night-coverage [email or web address] Password [email or web address] 10/28/2018, 1:15 PM

## 2018-10-28 NOTE — Plan of Care (Signed)
  Problem: Pain Managment: Goal: General experience of comfort will improve Outcome: Progressing   Problem: Safety: Goal: Ability to remain free from injury will improve Outcome: Progressing   Problem: Skin Integrity: Goal: Risk for impaired skin integrity will decrease Outcome: Progressing   

## 2018-10-28 NOTE — Plan of Care (Signed)
  Problem: Education: Goal: Knowledge of General Education information will improve Description: Including pain rating scale, medication(s)/side effects and non-pharmacologic comfort measures Outcome: Progressing   Problem: Health Behavior/Discharge Planning: Goal: Ability to manage health-related needs will improve Outcome: Progressing   Problem: Clinical Measurements: Goal: Ability to maintain clinical measurements within normal limits will improve Outcome: Progressing Goal: Will remain free from infection Outcome: Progressing Goal: Respiratory complications will improve Outcome: Progressing   Problem: Activity: Goal: Risk for activity intolerance will decrease Outcome: Progressing   Problem: Nutrition: Goal: Adequate nutrition will be maintained Outcome: Progressing   Problem: Coping: Goal: Level of anxiety will decrease Outcome: Progressing   Problem: Pain Managment: Goal: General experience of comfort will improve Outcome: Progressing   Problem: Safety: Goal: Ability to remain free from injury will improve Outcome: Progressing   Problem: Skin Integrity: Goal: Risk for impaired skin integrity will decrease Outcome: Progressing   

## 2018-10-28 NOTE — Progress Notes (Signed)
Per pt's daughter, Hope's, request, [facility] in [place], [place] given to [place], [place] to be considered as a SNF option for pt. Hope updated of pt status. All questions answered to satisfaction. Will continue to monitor.

## 2018-10-28 NOTE — TOC Initial Note (Signed)
Transition of Care [facility]) - Initial/Assessment Note    Patient Details  Name: Cindy Gibbs MRN: [id number] Date of Birth: 01-Feb-1941  Transition of Care [facility]) CM/SW Contact:    Alberteen Sam, [place] Phone Number: [phone] 10/28/2018, 11:51 AM  Clinical Narrative:                  CSW consulted with patient regarding PT recommendation of SNF, patient accepting however reports preference as [facility]. CSW informed of their COVID virus issues, patient gave CSW permission to fax out for bed offers. Patient asked CSW to reach out to daughter [place]. CSW consulted with patient's  daughter [facility] regarding preferences, CSW informed of [facility] acceptance. Daughter was very excited and reported wanting to accept bed offer at [facility]. CSW informed facility. MD reports potential discharge tomorrow 6/16 pending urine cultures.   Expected Discharge Plan: Skilled Nursing Facility Barriers to Discharge: Continued Medical Work up   Patient Goals and CMS Choice Patient states their goals for this hospitalization and ongoing recovery are:: to go to short term rehab then home CMS Medicare.gov Compare Post Acute Care list provided to:: Patient Choice offered to / list presented to : Patient  Expected Discharge Plan and Services Expected Discharge Plan: [place] Choice: [place] arrangements for the past 2 months: Single Family Home                                      Prior Living Arrangements/Services Living arrangements for the past 2 months: Single Family Home Lives with:: Self Patient language and need for interpreter reviewed:: Yes        Need for Family Participation in Patient Care: Yes (Comment) Care giver support system in place?: Yes (comment) Current home services: Homehealth aide Criminal Activity/Legal Involvement Pertinent to Current Situation/Hospitalization: No - Comment as needed  Activities of  Daily Living Home Assistive Devices/Equipment: Shower chair with back ADL Screening (condition at time of admission) Patient's cognitive ability adequate to safely complete daily activities?: Yes Is the patient deaf or have difficulty hearing?: No Does the patient have difficulty seeing, even when wearing glasses/contacts?: No Does the patient have difficulty concentrating, remembering, or making decisions?: No Patient able to express need for assistance with ADLs?: Yes Does the patient have difficulty dressing or bathing?: No Independently performs ADLs?: Yes (appropriate for developmental age) Does the patient have difficulty walking or climbing stairs?: Yes Weakness of Legs: Right Weakness of Arms/Hands: None  Permission Sought/Granted   Permission granted to share information with : Yes, Verbal Permission Granted  Share Information with NAME: Hope  Permission granted to share info w AGENCY: SNFs  Permission granted to share info w Relationship: daughter  Permission granted to share info w Contact Information: [phone]  Emotional Assessment Appearance:: Other (Comment Required(unable to assess) Attitude/Demeanor/Rapport: Gracious Affect (typically observed): Calm Orientation: : Oriented to Self, Oriented to Place, Oriented to  Time, Oriented to Situation Alcohol / Substance Use: Not Applicable Psych Involvement: No (comment)  Admission diagnosis:  Closed fracture of neck of left femur, initial encounter ([place]) [S72.002A] Patient Active Problem List   Diagnosis Date Noted  . Fall at home, initial encounter 10/25/2018  . Fracture of femoral neck, left, closed ([place]) 10/25/2018  . Fever 10/25/2018  . CKD (chronic kidney disease), stage III ([place]) 10/25/2018  . Chronic diastolic CHF (congestive heart  failure) (HCC) 10/25/2018  . UTI (urinary tract infection) 03/18/2015  . Acute encephalopathy 03/17/2015  . Right sided weakness 03/17/2015  . HLD (hyperlipidemia) 03/17/2015  .  Essential hypertension   . GERD (gastroesophageal reflux disease)   . Back pain   . Bilateral knee pain   . OSA (obstructive sleep apnea)   . Obesity   . Atrial fibrillation (HCC)   . Depression    PCP:  Tacey Ruizolleschel, Emily, FNP Pharmacy:   [facility] [id number]- [place], [place] - [address] AT [facility] [address] [place] [address] [place] [address] Phone: [phone] Fax: [phone]  [facility] [place] - [id number] [facility] [address] [place] [address] Phone: [phone] Fax: [phone]     Social Determinants of Health (SDOH) Interventions    Readmission Risk Interventions No flowsheet data found.

## 2018-10-28 NOTE — Progress Notes (Signed)
Orthopaedic Trauma Progress Note  S: Doing well this morning, having minimal pain. No complaints. Was able to get to edge of bed with therapy yesterday. Asking about going to rehab and how the process works with setting that up. Appears agreeable to going to SNF before returning home  O:  Vitals:   10/28/18 0345 10/28/18 0746  BP: 111/65 (!) 84/55  Pulse: 80 61  Resp:  16  Temp: 98 F (36.7 C) 97.9 F (36.6 C)  SpO2: 94% 97%    General - Sitting up in bed comfortably, NAD Cardiac - Heart RRR Respiratory - No increased work of breathing, lungs CTA anterior lung fields bilaterally Left Lower Extremity - Dressing removed, incision clean, dry, intact. Small bruise on lateral thigh, noted on previous exam. No tenderness to palpation of left hip, thigh, knee. Dorsiflexion/plantarflexion intact. Sensation intact to light touch of lower leg and thigh. Able to wiggle all toes. +DP pulse  Imaging: Stable post op imaging.  Labs:  No results found for this or any previous visit (from the past 24 hour(s)).  Assessment: 78 year old female s/p fall  Injuries: Left femoral neck fracture s/p percutaneous fixation  Weightbearing: WBAT LLE  Insicional and dressing care: Incision left open to air  Orthopedic device(s):none  CV/Blood loss: Hgb 10.2, stable from prior to surgery. Hemodynamically stable  Pain management: 1. Tylenol 325 mg q 6 hours PRN 2. Robaxin 500 mg q 6 hours PRN 3. Percocet 5/325 mg q 4 hours PRN 4. Lyrica 75 mg BID 5. Morphine 1 mg q 4 hours PRN  VTE prophylaxis: Okay to restart eliquis   ID: on Ceftriaxone for UTI, no additional abx needed  Foley/Lines: No foley, KVO IVFs  Medical co-morbidities: A. fib on Eliquis, hyperlipidemia, congestive heart failure, obstructive sleep apnea and chronic kidney disease    Dispo: PT eval, recommending SNF. Okay for discharge from ortho standpoint once cleared by medicine team and therapy  Follow - up plan: 2 weeks   Cindy Gibbs  A. [facility] ?[phone]? (phone)

## 2018-10-28 NOTE — Progress Notes (Signed)
Physical Therapy Treatment Patient Details Name: Cindy Gibbs MRN: [id number] DOB: 15-Aug-1940 Today's Date: 10/28/2018    History of Present Illness Pt is a 78 y.o. female with medical history significant of A. fib on Eliquis, hyperlipidemia, GERD, CHF, OSA, and CKD-3. She presented to the ED following a fall and left hip pain. Xray revealed L hip fx. She underwent ORIF 10/26/18.    PT Comments    NT came to hall and asked PTA for assistance to get patient back to bed.  Attempted slide board but she had soaked chair in urine and require sit to stand to get out of soiled chair and back to bed.  Pt achieved this with +2 max assistance and sara stedy.  Pt initially unsuccessful as she was not participating.  PTA educated on importance of her to assist with transfer then she was able to stand in frame with flexed hips rest forearms on cross bar.  Continue to recommend SNF placement at this time to improve strength and function before returning home.   Follow Up Recommendations  SNF;Supervision/Assistance - 24 hour     Equipment Recommendations  None recommended by PT    Recommendations for Other Services       Precautions / Restrictions Precautions Precautions: Fall Restrictions Weight Bearing Restrictions: Yes LLE Weight Bearing: Weight bearing as tolerated    Mobility  Bed Mobility Overal bed mobility: Needs Assistance Bed Mobility: Sit to Supine       Sit to supine: Max assist;+2 for physical assistance   General bed mobility comments: Pt required assistance to lower trunk and to lift B LEs back to bed.  Once in supine placed in trendelenberg and use bar to boost to [facility].  Transfers Overall transfer level: Needs assistance Equipment used: Ambulation equipment used(sara stedy) Transfers: Sit to/from Stand Sit to Stand: Max assist;+2 physical assistance         General transfer comment: Attempted to lateral scoot back to bed but patient had urinated in chair and this  mositure created friction across the board.  Utilized bed pad under patient and sara stedy top achieve standing.  Pt stood from chair, from plate height of stedy, and from elevated bed.  She required Max +2.  Ambulation/Gait                 Stairs             Wheelchair Mobility    Modified Rankin (Stroke Patients Only)       Balance Overall balance assessment: Needs assistance Sitting-balance support: Single extremity supported;Feet supported Sitting balance-Leahy Scale: Fair       Standing balance-Leahy Scale: Poor Standing balance comment: flexed forward                            Cognition Arousal/Alertness: Awake/alert Behavior During Therapy: WFL for tasks assessed/performed Overall Cognitive Status: Within Functional Limits for tasks assessed                                        Exercises      General Comments        Pertinent Vitals/Pain Pain Assessment: 0-10 Pain Score: 6  Pain Location: L hip Pain Descriptors / Indicators: Grimacing;Operative site guarding;Sore Pain Intervention(s): Monitored during session;Repositioned    Home Living  Prior Function            PT Goals (current goals can now be found in the care plan section) Acute Rehab PT Goals Patient Stated Goal: To get stronger Potential to Achieve Goals: Fair    Frequency    Min 3X/week      PT Plan Current plan remains appropriate    Co-evaluation              AM-PAC PT "6 Clicks" Mobility   Outcome Measure  Help needed turning from your back to your side while in a flat bed without using bedrails?: A Lot Help needed moving from lying on your back to sitting on the side of a flat bed without using bedrails?: A Lot Help needed moving to and from a bed to a chair (including a wheelchair)?: Total Help needed standing up from a chair using your arms (e.g., wheelchair or bedside chair)?: Total Help  needed to walk in hospital room?: Total Help needed climbing 3-5 steps with a railing? : Total 6 Click Score: 8    End of Session Equipment Utilized During Treatment: Oxygen;Gait belt Activity Tolerance: Patient limited by pain Patient left: in chair;with call bell/phone within reach;with chair alarm set;with family/visitor present Nurse Communication: Mobility status PT Visit Diagnosis: Other abnormalities of gait and mobility (R26.89);Pain;Muscle weakness (generalized) (M62.81) Pain - Right/Left: Left     Time: [id number] PT Time Calculation (min) (ACUTE ONLY): 24 min  Charges:  $Therapeutic Activity: 23-37 mins                     Joycelyn RuaAimee Kailin Principato, PTA Acute Rehabilitation Services Pager [phone] Office [phone]     Bronislaus Verdell Artis DelayJ Wladyslawa Disbro 10/28/2018, 4:55 PM

## 2018-10-29 DIAGNOSIS — E785 Hyperlipidemia, unspecified: Secondary | ICD-10-CM

## 2018-10-29 MED ORDER — SODIUM CHLORIDE 0.9 % IV SOLN: 25.0000 mg | Freq: Once | INTRAVENOUS | Status: DC

## 2018-10-29 MED ORDER — PROMETHAZINE HCL 25 MG/ML IJ SOLN: 12.5000 mg | Freq: Three times a day (TID) | INTRAMUSCULAR | Status: DC | PRN

## 2018-10-29 MED ORDER — LIDOCAINE VISCOUS HCL 2 % MT SOLN
15.0000 mL | Freq: Once | OROMUCOSAL | Status: AC
  Administered 2018-10-29: 15 mL via ORAL
  Filled 2018-10-29: qty 15

## 2018-10-29 MED ORDER — ALUM & MAG HYDROXIDE-SIMETH 200-200-20 MG/5ML PO SUSP
30.0000 mL | Freq: Once | ORAL | Status: AC
  Administered 2018-10-29: 30 mL via ORAL
  Filled 2018-10-29: qty 30

## 2018-10-29 MED ORDER — TRAMADOL HCL 50 MG PO TABS: 50.0000 mg | ORAL_TABLET | Freq: Four times a day (QID) | ORAL | 0 refills | Status: AC | PRN

## 2018-10-29 NOTE — Progress Notes (Signed)
PROGRESS NOTE    Lynnette CaffeyBarbara C Vandevelde  ZOX:[id number] DOB: 1940/05/20 DOA: 10/25/2018 PCP: Tacey Ruizolleschel, Emily, FNP    Brief Narrative:  78 year old female with history of atrial fibrillation on Eliquis, hyperlipidemia, gastroesophageal reflux disease, congestive heart failure, obstructive sleep apnea, CKD stage III had a fall at home and started to experience left hip pain. Patient fell about 3 days back, when EMS arrived patient refused transport. Patient continued to have worsening of the pain, came to the emergency department.  Work-up in the emergency department patient was found to have nondisplaced left femoral neck fracture on the x-ray. Patient was also found to have urinary tract infection. Patient had a fever of 100.6, bradycardia. Plan for percutaneous fixation on 10/27/2018.  Assessment & Plan:   Principal Problem:   Fracture of femoral neck, left, closed (HCC) Active Problems:   Essential hypertension   GERD (gastroesophageal reflux disease)   Atrial fibrillation (HCC)   Depression   HLD (hyperlipidemia)   UTI (urinary tract infection)   Fall at home, initial encounter   CKD (chronic kidney disease), stage III (HCC)   Chronic diastolic CHF (congestive heart failure) (HCC)   Left femoral neck fracture s/p mechanical fall - Orthopedic surgery following - appreciate insight and recs; tolerated procedure well - Pain well controlled - Started back on Eliquis - Underwent percutaneous fixation of the left femoral neck on 10/26/2018 - Evaluated by PT, recommended SNF - likely DC in 24-48h  ? Urinary tract infection, POA - Follow-up with urine cultures-greater than 100,000 colonies of E. coli.  - Sensitivities pending; S/P treatment with Rocephin  HTN, essential:  -Very well controlled on home bisoprolol, hctz -Holding lasix given low-normal BP -IV hydralazine prn  GERD (gastroesophageal reflux disease): -Protonix  Paroxysmal atrial Fibrillation, currently rate controlled:  -CHA2DS2-VASc Score is 4.  -Resume eliquis -Hold metoprolol  Depression: -Continue home Wellbutrin  HLD (hyperlipidemia): -Continue pravastatin  Fall at home, initial encounter: Patient seems to have had mechanical fall.  CT head and CT of the C-spine is negative for bony fracture. -PT/OT ongoing  CKD (chronic kidney disease), stage III (HCC):  -Close to baseline.  Baseline creatinine 0.9-1.0.  Her creatinine is 1.16, BUN 25. -Follow-up by BMP  Chronic diastolic CHF (congestive heart failure) (HCC):  -2D echo on 03/18/2015 showed EF of 55 to 60%.   -Appears euvolemic  -Resume lasix once able to tolerate given borderline BP as above  Elevated LFTs -Most likely from [facility]  Morbid obesity -Counseling done regarding diet and exercise -Continue with low calorie, high protein diet    DVT prophylaxis: SCDs Code Status: Full   Disposition Plan: Continue as inpatient given ongoing need for PT eval and treatment as well as ongoing post-op care and pain management. Pending PT OT evaluation possible need for SNF versus home with home health.  Consultants:   Orthopedic surgery  Antimicrobials:  Rocephin completed  Subjective: Acute issues or events overnight, patient states pain is well controlled, tolerating p.o., anxious for physical therapy later today.  Objective: Vitals:   10/29/18 0551 10/29/18 0858 10/29/18 1051 10/29/18 1542  BP: 118/77 (!) 100/47 118/84 94/74  Pulse: 68 61 62 67  Resp: 16 16  15   Temp: 97.8 F (36.6 C) 99.3 F (37.4 C)  98.3 F (36.8 C)  TempSrc: Oral Oral  Oral  SpO2: 95% 100%  94%  Weight:      Height:        Intake/Output Summary (Last 24 hours) at 10/29/2018 1626 Last data filed  at 10/29/2018 1500 Gross per 24 hour  Intake 720 ml  Output 2100 ml  Net -1380 ml   Filed Weights   10/25/18 1528 10/25/18 2208  Weight: 108 kg 108.4 kg    Examination:  General exam: Appears calm and comfortable  Respiratory system: Clear to  auscultation. Respiratory effort normal. Cardiovascular system: S1 & S2 heard, RRR. No JVD, murmurs, rubs, gallops or clicks. No pedal edema. Gastrointestinal system: Abdomen is nondistended, soft and nontender. No organomegaly or masses felt. Normal bowel sounds heard. Central nervous system: Alert and oriented. No focal neurological deficits. Extremities: Left hip in the dressing skin: No rashes, lesions or ulcers Psychiatry: Judgement and insight appear normal. Mood & affect appropriate.     Data Reviewed: I have personally reviewed following labs and imaging studies  CBC: Recent Labs  Lab 10/25/18 1558 10/26/18 0357 10/27/18 0539  WBC 10.1 8.7 10.7*  NEUTROABS 8.1*  --   --   HGB 11.0* 10.1* 10.2*  HCT 36.4 33.3* 33.0*  MCV 90.8 90.0 89.7  PLT 165 144* 284   Basic Metabolic Panel: Recent Labs  Lab 10/25/18 1558 10/26/18 0357  NA 137 137  K 4.5 4.1  CL 102 104  CO2 26 23  GLUCOSE 112* 93  BUN 25* 22  CREATININE 1.16* 1.16*  CALCIUM 9.3 8.8*   GFR: Estimated Creatinine Clearance: 49.6 mL/min (A) (by C-G formula based on SCr of 1.16 mg/dL (H)). Liver Function Tests: Recent Labs  Lab 10/25/18 1558  AST 77*  ALT 73*  ALKPHOS 131*  BILITOT 1.5*  PROT 7.6  ALBUMIN 3.5   No results for input(s): LIPASE, AMYLASE in the last 168 hours. No results for input(s): AMMONIA in the last 168 hours. Coagulation Profile: Recent Labs  Lab 10/25/18 1737  INR 1.2   Cardiac Enzymes: Recent Labs  Lab 10/25/18 1558  CKTOTAL 21*   BNP (last 3 results) No results for input(s): PROBNP in the last 8760 hours. HbA1C: No results for input(s): HGBA1C in the last 72 hours. CBG: No results for input(s): GLUCAP in the last 168 hours. Lipid Profile: No results for input(s): CHOL, HDL, LDLCALC, TRIG, CHOLHDL, LDLDIRECT in the last 72 hours. Thyroid Function Tests: No results for input(s): TSH, T4TOTAL, FREET4, T3FREE, THYROIDAB in the last 72 hours. Anemia Panel: No results  for input(s): VITAMINB12, FOLATE, FERRITIN, TIBC, IRON, RETICCTPCT in the last 72 hours. Sepsis Labs: Recent Labs  Lab 10/25/18 1630 10/25/18 2221  LATICACIDVEN 0.9 0.7    Recent Results (from the past 240 hour(s))  SARS Coronavirus 2 (CEPHEID - Performed in [facility] hospital lab), Hosp Order     Status: None   Collection Time: 10/25/18  3:59 PM   Specimen: Nasopharyngeal Swab  Result Value Ref Range Status   SARS Coronavirus 2 NEGATIVE NEGATIVE Final    Comment: (NOTE) If result is NEGATIVE SARS-CoV-2 target nucleic acids are NOT DETECTED. The SARS-CoV-2 RNA is generally detectable in upper and lower  respiratory specimens during the acute phase of infection. The lowest  concentration of SARS-CoV-2 viral copies this assay can detect is 250  copies / mL. A negative result does not preclude SARS-CoV-2 infection  and should not be used as the sole basis for treatment or other  patient management decisions.  A negative result may occur with  improper specimen collection / handling, submission of specimen other  than nasopharyngeal swab, presence of viral mutation(s) within the  areas targeted by this assay, and inadequate number of viral copies  (<  250 copies / mL). A negative result must be combined with clinical  observations, patient history, and epidemiological information. If result is POSITIVE SARS-CoV-2 target nucleic acids are DETECTED. The SARS-CoV-2 RNA is generally detectable in upper and lower  respiratory specimens dur ing the acute phase of infection.  Positive  results are indicative of active infection with SARS-CoV-2.  Clinical  correlation with patient history and other diagnostic information is  necessary to determine patient infection status.  Positive results do  not rule out bacterial infection or co-infection with other viruses. If result is PRESUMPTIVE POSTIVE SARS-CoV-2 nucleic acids MAY BE PRESENT.   A presumptive positive result was obtained on the  submitted specimen  and confirmed on repeat testing.  While 2019 novel coronavirus  (SARS-CoV-2) nucleic acids may be present in the submitted sample  additional confirmatory testing may be necessary for epidemiological  and / or clinical management purposes  to differentiate between  SARS-CoV-2 and other Sarbecovirus currently known to infect humans.  If clinically indicated additional testing with an alternate test  methodology [phone] is advised. The SARS-CoV-2 RNA is generally  detectable in upper and lower respiratory sp ecimens during the acute  phase of infection. The expected result is Negative. Fact Sheet for Patients:  BoilerBrush.com.cyhttps://www.fda.gov/media/136312/download Fact Sheet for Healthcare Providers: https://pope.com/https://www.fda.gov/media/136313/download This test is not yet approved or cleared by the [place] FDA and has been authorized for detection and/or diagnosis of SARS-CoV-2 by FDA under an Emergency Use Authorization (EUA).  This EUA will remain in effect (meaning this test can be used) for the duration of the COVID-19 declaration under Section 564(b)(1) of the Act, 21 U.S.C. section 360bbb-3(b)(1), unless the authorization is terminated or revoked sooner. Performed at [facility], [address] [place], [place] [address]   Blood culture (routine x 2)     Status: None (Preliminary result)   Collection Time: 10/25/18  5:37 PM   Specimen: BLOOD  Result Value Ref Range Status   Specimen Description BLOOD BLOOD RIGHT HAND  Final   Special Requests   Final    BOTTLES DRAWN AEROBIC AND ANAEROBIC Blood Culture adequate volume   Culture   Final    NO GROWTH 4 DAYS Performed at [facility], [address] [place], [place] [address]    Report Status PENDING  Incomplete  Blood culture (routine x 2)     Status: None (Preliminary result)   Collection Time: 10/25/18  5:37 PM   Specimen: BLOOD  Result Value Ref Range Status   Specimen Description BLOOD RIGHT  ANTECUBITAL  Final   Special Requests   Final    BOTTLES DRAWN AEROBIC AND ANAEROBIC Blood Culture adequate volume   Culture   Final    NO GROWTH 4 DAYS Performed at [facility], [address] [place], [place] [address]    Report Status PENDING  Incomplete  Urine culture     Status: Abnormal (Preliminary result)   Collection Time: 10/25/18  7:23 PM   Specimen: Urine, Random  Result Value Ref Range Status   Specimen Description URINE, RANDOM  Final   Special Requests NONE  Final   Culture (A)  Final    >=100,000 COLONIES/mL ESCHERICHIA COLI SUSCEPTIBILITIES TO FOLLOW Performed at [facility], [address] [place], [place] [address]    Report Status PENDING  Incomplete  Surgical PCR screen     Status: Abnormal   Collection Time: 10/25/18 11:45 PM   Specimen: Nasal Mucosa;  Nasal Swab  Result Value Ref Range Status   MRSA, PCR NEGATIVE NEGATIVE Final   Staphylococcus aureus POSITIVE (A) NEGATIVE Final    Comment: (NOTE) The Xpert SA Assay (FDA approved for NASAL specimens in patients 76 years of age and older), is one component of a comprehensive surveillance program. It is not intended to diagnose infection nor to guide or monitor treatment. Performed at [facility], [address] [place], [place] [address]          Radiology Studies: No results found.      Scheduled Meds: . apixaban  5 mg Oral BID  . bisoprolol-hydrochlorothiazide  1 tablet Oral Daily  . buPROPion  300 mg Oral q morning - 10a  . Chlorhexidine Gluconate Cloth  6 each Topical Daily  . multivitamin with minerals  1 tablet Oral Daily  . mupirocin ointment  1 application Nasal BID  . pantoprazole  40 mg Oral Daily  . pravastatin  40 mg Oral q1800  . pregabalin  75 mg Oral BID  . Ensure Max Protein  11 oz Oral BID  . sertraline  200 mg Oral QHS   Continuous Infusions: . cefTRIAXone (ROCEPHIN)  IV 1 g (10/28/18 2057)  . lactated ringers 10 mL/hr at 10/26/18 1357      LOS: 4 days    Time spent: 35 minutes    Azucena FallenWilliam C Dejan Angert, MD [facility] Pager [phone]  If 7PM-7AM, please contact night-coverage [email or web address] Password [email or web address] 10/29/2018, 4:26 PM

## 2018-10-29 NOTE — Progress Notes (Signed)
Orthopaedic Trauma Progress Note  S: Doing well this morning, having minimal pain. No complaints. Hopeful to get out of the hospital today    O:  Vitals:   10/28/18 2020 10/29/18 0551  BP: (!) 110/56 118/77  Pulse: 64 68  Resp: 18 16  Temp: 98 F (36.7 C) 97.8 F (36.6 C)  SpO2: 92% 95%    General - Resting in bed comfortably, NAD Cardiac - Heart RRR Respiratory - No increased work of breathing, lungs CTA anterior lung fields bilaterally Left Lower Extremity - incision is clean, dry, intact. No/minimal tenderness to palpation of left hip, thigh, knee. Dorsiflexion/planatrflexion intact. Sensation intact to light touch of lower leg and thigh. Able to wiggle all toes. +DP pulse  Imaging: Stable post op imaging.  Labs:  No results found for this or any previous visit (from the past 24 hour(s)).  Assessment: 78 year old female s/p fall  Injuries: Left femoral neck fracture s/p percutaneous fixation  Weightbearing: WBAT LLE  Insicional and dressing care: Incision can be left open to air  Orthopedic device(s):none  CV/Blood loss: Hgb 10.2, stable from prior to surgery. Hemodynamically stable  Pain management: 1. Tylenol 325 mg q 6 hours PRN 2. Robaxin 500 mg q 6 hours PRN 3. Percocet 5/325 mg q 4 hours PRN 4. Lyrica 75 mg BID 5. Morphine 1 mg q 4 hours PRN  VTE prophylaxis: Eliquis  ID: on Ceftriaxone for UTI, no additional abx needed  Foley/Lines: No foley, KVO IVFs  Medical co-morbidities: A. fib on Eliquis, hyperlipidemia, congestive heart failure, obstructive sleep apnea and chronic kidney disease    Dispo: PT eval today, okay for discharge from ortho standpoint once cleared by medicine team and therapy.   Pain medication Rx has been written for discharge, placed in the chart. Continue home Eliquis for DVT prophylaxis  Follow - up plan: 2 weeks for repeat x-rays  Parvin Stetzer A. [facility] ?([phone]? (phone)

## 2018-10-29 NOTE — Progress Notes (Signed)
CSW followed up with [facility] they report still pending insurance auth with [facility]. They initiated auth yesterday so hopeful will have by end of day today.   Cindy Gibbs, [facility]

## 2018-10-29 NOTE — Plan of Care (Signed)
Problem: Education: Goal: Knowledge of General Education information will improve Description: Including pain rating scale, medication(s)/side effects and non-pharmacologic comfort measures Outcome: Progressing   Problem: Health Behavior/Discharge Planning: Goal: Ability to manage health-related needs will improve Outcome: Progressing   Problem: Nutrition: Goal: Adequate nutrition will be maintained Outcome: Progressing   Problem: Elimination: Goal: Will not experience complications related to urinary retention Outcome: Progressing   Problem: Pain Managment: Goal: General experience of comfort will improve Outcome: Progressing   Problem: Safety: Goal: Ability to remain free from injury will improve Outcome: Progressing   Problem: Skin Integrity: Goal: Risk for impaired skin integrity will decrease Outcome: Progressing   

## 2018-10-29 NOTE — Progress Notes (Signed)
RN attempted to call pt's family to provide update on pt's status. Unsuccessful at this time. Will continue to monitor.

## 2018-10-30 LAB — CULTURE, BLOOD (ROUTINE X 2)
Culture: NO GROWTH
Culture: NO GROWTH
Special Requests: ADEQUATE
Special Requests: ADEQUATE

## 2018-10-30 LAB — URINE CULTURE: Culture: >=100000 — AB

## 2018-10-30 NOTE — Progress Notes (Signed)
Physical Therapy Treatment Patient Details Name: Cindy Gibbs MRN: [id number] DOB: 09/24/1940 Today's Date: 10/30/2018    History of Present Illness Pt is a 78 y.o. female with medical history significant of A. fib on Eliquis, hyperlipidemia, GERD, CHF, OSA, and CKD-3. She presented to the ED following a fall and left hip pain. Xray revealed L hip fx. She underwent ORIF 10/26/18.    PT Comments    Pt performed supine to sit and sit to stand from elevated surface height with moderate assistance +1.  She stood with sara stedy frame to allow for blocking of B knees.  Pt moving with improved ease compared to last session.  She is able to tolerate standing for around 1 min.  Plan remains appropriate for SNF to improve strength and function before returning home.      Follow Up Recommendations  SNF;Supervision/Assistance - 24 hour     Equipment Recommendations  None recommended by PT    Recommendations for Other Services       Precautions / Restrictions Precautions Precautions: Fall Restrictions Weight Bearing Restrictions: Yes LLE Weight Bearing: Weight bearing as tolerated    Mobility  Bed Mobility Overal bed mobility: Needs Assistance Bed Mobility: Sit to Supine     Supine to sit: HOB elevated;Mod assist     General bed mobility comments: Heavy use of bed rails but able to advance LEs unassisted.  pt did require assistance to elevate trunk into sitting and to scoot right hip forward.  Once in sitting she was able to scoot forward to allow her feet to touch the floor.  Transfers Overall transfer level: Needs assistance Equipment used: Ambulation equipment used(sara stedy) Transfers: Sit to/from Stand Sit to Stand: Mod assist         General transfer comment: Pt performed from elevated surface height of bed in sara stedy frame.  Cues for hand placement on cross bar.  Pt able to rise into standing with moderate assistance to boost and facilitate hip  extension.  Ambulation/Gait Ambulation/Gait assistance: (NT- unable)               Stairs             Wheelchair Mobility    Modified Rankin (Stroke Patients Only)       Balance Overall balance assessment: Needs assistance Sitting-balance support: Single extremity supported;Feet supported Sitting balance-Leahy Scale: Fair       Standing balance-Leahy Scale: Poor                              Cognition Arousal/Alertness: Awake/alert Behavior During Therapy: WFL for tasks assessed/performed Overall Cognitive Status: Within Functional Limits for tasks assessed                                        Exercises General Exercises - Lower Extremity Ankle Circles/Pumps: AROM;Both;Seated;20 reps Quad Sets: AROM;10 reps;Supine;Both Heel Slides: AAROM;5 reps;Supine;Both Hip ABduction/ADduction: AAROM;5 reps;Supine;Both    General Comments        Pertinent Vitals/Pain Pain Assessment: 0-10 Pain Score: 7  Pain Location: L hip Pain Descriptors / Indicators: Grimacing;Operative site guarding;Sore Pain Intervention(s): Monitored during session;Repositioned    Home Living                      Prior Function  PT Goals (current goals can now be found in the care plan section) Acute Rehab PT Goals Patient Stated Goal: To get stronger Potential to Achieve Goals: Good    Frequency    Min 3X/week      PT Plan Current plan remains appropriate    Co-evaluation              AM-PAC PT "6 Clicks" Mobility   Outcome Measure  Help needed turning from your back to your side while in a flat bed without using bedrails?: A Lot Help needed moving from lying on your back to sitting on the side of a flat bed without using bedrails?: A Lot Help needed moving to and from a bed to a chair (including a wheelchair)?: A Lot Help needed standing up from a chair using your arms (e.g., wheelchair or bedside chair)?: A  Lot Help needed to walk in hospital room?: Total Help needed climbing 3-5 steps with a railing? : Total 6 Click Score: 10    End of Session   Activity Tolerance: Patient limited by pain Patient left: in chair;with call bell/phone within reach;with chair alarm set;with family/visitor present Nurse Communication: Mobility status PT Visit Diagnosis: Other abnormalities of gait and mobility (R26.89);Pain;Muscle weakness (generalized) (M62.81) Pain - Right/Left: Left Pain - part of body: Hip     Time: 1010-1032 PT Time Calculation (min) (ACUTE ONLY): 22 min  Charges:  $Therapeutic Activity: 8-22 mins                     Joycelyn RuaAimee Jaine Estabrooks, PTA Acute Rehabilitation Services Pager [phone] Office [phone]     Montey Ebel Artis DelayJ Delray Reza 10/30/2018, 10:40 AM

## 2018-10-30 NOTE — TOC Transition Note (Signed)
Transition of Care [facility]) - CM/SW Discharge Note   Patient Details  Name: Cindy Gibbs MRN: [id number] Date of Birth: 11/05/1940  [facility] CM/SW Contact:  Alberteen Sam, LCSW Phone Number: 10/30/2018, 12:27 PM   Clinical Narrative:     Patient will DC to: [facility] Anticipated DC date: 10/30/2018 Family notified:[facility] Transport ES:PQZR  Per MD patient ready for DC to [facility] . RN, patient, patient's family, and facility notified of DC. Discharge Summary sent to facility. RN given number for report [phone]. DC packet on chart. Ambulance transport requested for patient.  CSW signing off.  [address], [place]   Final next level of care: Skilled Nursing Facility Barriers to Discharge: No Barriers Identified   Patient Goals and CMS Choice Patient states their goals for this hospitalization and ongoing recovery are:: to go to short term rehab then home CMS Medicare.gov Compare Post Acute Care list provided to:: Patient Choice offered to / list presented to : Patient  Discharge Placement PASRR number recieved: 10/27/18            Patient chooses bed at: [facility] Patient to be transferred to facility by: [facility] Name of family member notified: Sterling Big Patient and family notified of of transfer: 10/30/18  Discharge Plan and Services     Post Acute Care Choice: [facility]                               Social Determinants of Health (SDOH) Interventions     Readmission Risk Interventions No flowsheet data found.

## 2018-10-30 NOTE — Progress Notes (Signed)
RN attempted to call again no answer will try one more time before d/c

## 2018-10-30 NOTE — Progress Notes (Signed)
RN attempted to call [facility] again no luck will. Still awaiting PTAR

## 2018-10-30 NOTE — Discharge Instructions (Addendum)
Orthopaedic Trauma Service Discharge Instructions   General Discharge Instructions  WEIGHT BEARING STATUS: Weight-bearing as tolerated on the left leg  RANGE OF MOTION/ACTIVITY: unrestricted range of motion of the left hip and knee  Wound Care: Incisions can be left open to air if there is no drainage. Okay to shower if no drainage from incisions.  DVT/PE prophylaxis: Continue home dose of Eliquis  Diet: as you were eating previously.  Can use over the counter stool softeners and bowel preparations, such as Miralax, to help with bowel movements.  Narcotics can be constipating.  Be sure to drink plenty of fluids  PAIN MEDICATION USE AND EXPECTATIONS  You have likely been given narcotic medications to help control your pain.  After a traumatic event that results in an fracture (broken bone) with or without surgery, it is ok to use narcotic pain medications to help control one's pain.  We understand that everyone responds to pain differently and each individual patient will be evaluated on a regular basis for the continued need for narcotic medications. Ideally, narcotic medication use should last no more than 6-8 weeks (coinciding with fracture healing).   As a patient it is your responsibility as well to monitor narcotic medication use and report the amount and frequency you use these medications when you come to your office visit.   We would also advise that if you are using narcotic medications, you should take a dose prior to therapy to maximize you participation.  IF YOU ARE ON NARCOTIC MEDICATIONS IT IS NOT PERMISSIBLE TO OPERATE A MOTOR VEHICLE (MOTORCYCLE/CAR/TRUCK/MOPED) OR HEAVY MACHINERY DO NOT MIX NARCOTICS WITH OTHER CNS (CENTRAL NERVOUS SYSTEM) DEPRESSANTS SUCH AS ALCOHOL   STOP SMOKING OR USING NICOTINE PRODUCTS!!!!  As discussed nicotine severely impairs your body's ability to heal surgical and traumatic wounds but also impairs bone healing.  Wounds and bone heal by forming  microscopic blood vessels (angiogenesis) and nicotine is a vasoconstrictor (essentially, shrinks blood vessels).  Therefore, if vasoconstriction occurs to these microscopic blood vessels they essentially disappear and are unable to deliver necessary nutrients to the healing tissue.  This is one modifiable factor that you can do to dramatically increase your chances of healing your injury.    (This means no smoking, no nicotine gum, patches, etc)  DO NOT USE NONSTEROIDAL ANTI-INFLAMMATORY DRUGS (NSAID'S)  Using products such as Advil (ibuprofen), Aleve (naproxen), Motrin (ibuprofen) for additional pain control during fracture healing can delay and/or prevent the healing response.  If you would like to take over the counter (OTC) medication, Tylenol (acetaminophen) is ok.  However, some narcotic medications that are given for pain control contain acetaminophen as well. Therefore, you should not exceed more than 4000 mg of tylenol in a day if you do not have liver disease.  Also note that there are may OTC medicines, such as cold medicines and allergy medicines that my contain tylenol as well.  If you have any questions about medications and/or interactions please ask your doctor/PA or your pharmacist.      ICE AND ELEVATE INJURED/OPERATIVE EXTREMITY  Using ice and elevating the injured extremity above your heart can help with swelling and pain control.  Icing in a pulsatile fashion, such as 20 minutes on and 20 minutes off, can be followed.    Do not place ice directly on skin. Make sure there is a barrier between to skin and the ice pack.    Using frozen items such as frozen peas works well as the conform  nicely to the are that needs to be iced.  USE AN ACE WRAP OR TED HOSE FOR SWELLING CONTROL  In addition to icing and elevation, Ace wraps or TED hose are used to help limit and resolve swelling.  It is recommended to use Ace wraps or TED hose until you are informed to stop.    When using Ace Wraps  start the wrapping distally (farthest away from the body) and wrap proximally (closer to the body)   Example: If you had surgery on your leg or thing and you do not have a splint on, start the ace wrap at the toes and work your way up to the thigh        If you had surgery on your upper extremity and do not have a splint on, start the ace wrap at your fingers and work your way up to the upper arm   [place]: [phone]   VISIT OUR WEBSITE FOR ADDITIONAL INFORMATION: [email or web address]    Information on my medicine - ELIQUIS (apixaban)  This medication education was reviewed with me or my healthcare representative as part of my discharge preparation.    Why was Eliquis prescribed for you? Eliquis was prescribed for you to reduce the risk of a blood clot forming that can cause a stroke if you have a medical condition called atrial fibrillation (a type of irregular heartbeat).  What do You need to know about Eliquis ? Take your Eliquis TWICE DAILY - one tablet in the morning and one tablet in the evening with or without food. If you have difficulty swallowing the tablet whole please discuss with your pharmacist how to take the medication safely.  Take Eliquis exactly as prescribed by your doctor and DO NOT stop taking Eliquis without talking to the doctor who prescribed the medication.  Stopping may increase your risk of developing a stroke.  Refill your prescription before you run out.  After discharge, you should have regular check-up appointments with your healthcare provider that is prescribing your Eliquis.  In the future your dose may need to be changed if your kidney function or weight changes by a significant amount or as you get older.  What do you do if you miss a dose? If you miss a dose, take it as soon as you remember on the same day and resume taking twice daily.  Do not take more than one dose of ELIQUIS at the same time to make up  a missed dose.  Important Safety Information A possible side effect of Eliquis is bleeding. You should call your healthcare provider right away if you experience any of the following: ? Bleeding from an injury or your nose that does not stop. ? Unusual colored urine (red or dark brown) or unusual colored stools (red or black). ? Unusual bruising for unknown reasons. ? A serious fall or if you hit your head (even if there is no bleeding).  Some medicines may interact with Eliquis and might increase your risk of bleeding or clotting while on Eliquis. To help avoid this, consult your healthcare provider or pharmacist prior to using any new prescription or non-prescription medications, including herbals, vitamins, non-steroidal anti-inflammatory drugs (NSAIDs) and supplements.  This website has more information on Eliquis (apixaban): http://www.eliquis.com/eliquis/home

## 2018-10-30 NOTE — Progress Notes (Signed)
RN called [facility] main line and got no answer. Spoke to Jackson the Mining engineer who connected me to the Director or rehab still no answer. Caryl Pina SW gave me her contact Kelly's number over at [facility] and no answer as well left a message will try again

## 2018-10-30 NOTE — Discharge Summary (Signed)
Physician Discharge Summary  Cindy CILIBERTO UJW:[id number] DOB: 1941/05/02 DOA: 10/25/2018  PCP: Tacey Ruiz, FNP  Admit date: 10/25/2018 Discharge date: 10/30/2018  Admitted From: SNF Disposition: SNF  Recommendations for Outpatient Follow-up:  1. Follow up with PCP in 1-2 weeks 2. Please obtain BMP/CBC in one week  Discharge Condition: Stable CODE STATUS: Full Diet recommendation: As tolerated  Brief/Interim Summary: 78 year old female with history of atrial fibrillation on Eliquis, hyperlipidemia, gastroesophageal reflux disease, congestive heart failure, obstructive sleep apnea, CKD stage III had a fall at home and started to experience left hip pain. Patient fell about 3 days back, when EMS arrived patient refused transport. Patient continued to have worsening of the pain, came to the emergency department.  Work-up in the emergency department patient was found to have nondisplaced left femoral neck fracture on the x-ray. Patient was also found to have urinary tract infection. Patient had a fever of 100.6, bradycardia. Plan for fixation on 10/27/2018.  Patient admitted as above after mechanical fall, now status post ORIF with orthopedic surgery for hip repair of left femur neck.  She otherwise had uneventful stay, patient had complaints of urinary symptoms with questionable UA, treated for UTI while in the hospital which was POA.  Patient otherwise stable and agreeable for discharge back to nursing facility for ongoing PT management.  Follow-up evaluation in the next 3 to 5 days to ensure resolution of UTI which is now status post treatment with ceftriaxone.  Discharge Diagnoses:  Principal Problem:   Fracture of femoral neck, left, closed (HCC) Active Problems:   Essential hypertension   GERD (gastroesophageal reflux disease)   Atrial fibrillation (HCC)   Depression   HLD (hyperlipidemia)   UTI (urinary tract infection)   Fall at home, initial encounter   CKD (chronic kidney  disease), stage III (HCC)   Chronic diastolic CHF (congestive heart failure) (HCC)  Left femoral neck fracture s/p mechanical fall - Orthopedic surgery following - appreciate insight and recs; tolerated procedure well - Pain well controlled - Started back on Eliquis - Underwent percutaneous fixation of the left femoral neck on 10/26/2018 - Evaluated by PT  Urinary tract infection 2/2 E. Coli, POA, resolved - Follow-up with urine cultures-greater than 100,000 colonies of E. coli.  - Sensitivities pending; S/P treatment with Rocephin  HTN, essential:  -Very well controlled on home bisoprolol, hctz -Resume home Lasix discharge  GERD (gastroesophageal reflux disease): -Protonix  Paroxysmal atrial Fibrillation, currently rate controlled: -CHA2DS2-VASc Scoreis 4.  -Resume eliquis -DC metoprolol  Depression: -Continue home Wellbutrin  HLD (hyperlipidemia): -Continue pravastatin  Fall at home, initial encounter:Patient seems to have had mechanical fall. CT head and CT of the C-spine is negative for bony fracture. -PT at SNF  CKD (chronic kidney disease), stage III (HCC): -Close to baseline. Baseline creatinine 0.9-1.0. Her creatinine is 1.16, BUN 25. -Follow-upby BMP  Chronic diastolic CHF (congestive heart failure) (HCC): -2D echo on 03/18/2015 showed EF of 55 to 60%. -Appears euvolemic  -Resume lasix once able to tolerate given borderline BP as above  Elevated LFTs -Most likely from [place]  Morbid obesity -Counseling done regarding diet and exercise -Continue with low calorie, high protein diet  Discharge Instructions    Diet - low sodium heart healthy   Complete by: As directed    Increase activity slowly   Complete by: As directed      Allergies as of 10/30/2018      Reactions   Codeine Nausea Only      Medication List  STOP taking these medications   metoprolol succinate 50 MG 24 hr tablet Commonly known as: TOPROL-XL   Oxycodone HCl  20 MG Tabs     TAKE these medications   acetaminophen 325 MG tablet Commonly known as: TYLENOL Take 325 mg by mouth every 6 (six) hours as needed for mild pain or headache.   bisoprolol-hydrochlorothiazide 5-6.25 MG tablet Commonly known as: ZIAC Take 1 tablet by mouth daily.   buPROPion 150 MG 24 hr tablet Commonly known as: WELLBUTRIN XL Take 300 mg by mouth every morning.   cyanocobalamin 1000 MCG/ML injection Commonly known as: (VITAMIN B-12) Inject 1,000 mcg into the muscle every 30 (thirty) days.   diphenhydrAMINE 25 mg capsule Commonly known as: BENADRYL Take 25 mg by mouth every 6 (six) hours as needed for allergies (or nasal drainage).   Eliquis 5 MG Tabs tablet Generic drug: apixaban Take 5 mg by mouth 2 (two) times daily.   furosemide 20 MG tablet Commonly known as: LASIX Take 20 mg by mouth daily.   lidocaine 5 % ointment Commonly known as: XYLOCAINE Apply 1 application topically as needed for mild pain (to affected site).   lovastatin 40 MG tablet Commonly known as: MEVACOR Take 40 mg by mouth at bedtime.   nitroGLYCERIN 0.4 MG SL tablet Commonly known as: NITROSTAT Place 0.4 mg under the tongue every 5 (five) minutes x 3 doses as needed (CALL 9-1-1, if no relief).   omeprazole 20 MG capsule Commonly known as: PRILOSEC Take 20 mg by mouth 2 (two) times a day.   pregabalin 75 MG capsule Commonly known as: LYRICA Take 75 mg by mouth 2 (two) times daily.   ProAir HFA 108 (90 Base) MCG/ACT inhaler Generic drug: albuterol Inhale 2 puffs into the lungs every 6 (six) hours as needed for wheezing or shortness of breath.   albuterol (2.5 MG/3ML) 0.083% nebulizer solution Commonly known as: PROVENTIL Take 2.5 mg by nebulization every 6 (six) hours as needed for wheezing or shortness of breath.   sertraline 100 MG tablet Commonly known as: ZOLOFT Take 200 mg by mouth at bedtime.   traMADol 50 MG tablet Commonly known as: Ultram Take 1 tablet (50 mg  total) by mouth every 6 (six) hours as needed. What changed:   when to take this  reasons to take this       Contact information for follow-up providers    Haddix, Gillie MannersKevin P, MD. Schedule an appointment as soon as possible for a visit in 2 week(s).   Specialty: Orthopedic Surgery Why: repeat x-rays Contact information: [address] [place] [address] [phone]            Contact information for after-discharge care    Destination    [facility] .   Service: Skilled Nursing Contact information: [address] [place] [id number] [phone]                 Allergies  Allergen Reactions  . Codeine Nausea Only    Consultations:  Orthopedic surgery   Procedures/Studies: Dg Chest 1 View  Result Date: 10/25/2018 CLINICAL DATA:  Larey SeatFell 1-2 days ago. Leg and back pain. Left femoral neck fracture. EXAM: CHEST  1 VIEW COMPARISON:  05/08/2018 FINDINGS: Cardiac silhouette is top-normal in size. No mediastinal or hilar masses. No convincing adenopathy. Clear lungs.  No pleural effusion or pneumothorax. Skeletal structures are demineralized but grossly intact. IMPRESSION: No acute cardiopulmonary disease. Electronically Signed   By: Amie Portlandavid  Ormond  M.D.   On: 10/25/2018 17:50   Dg Pelvis 1-2 Views  Result Date: 10/25/2018 CLINICAL DATA:  Larey Seat 1 or 2 days ago.  Right leg pain. EXAM: PELVIS - 1-2 VIEW COMPARISON:  Abdomen and pelvis CT, 08/19/2008 FINDINGS: There is a non comminuted, subcapital fracture of the left femoral neck, without significant displacement but with valgus angulation. No other fracture.  No bone lesion. Hip joints, SI joints and symphysis pubis are normally aligned. Skeletal structures are diffusely demineralized. They are soft tissue edema adjacent to the fractured left femoral neck. IMPRESSION: 1. Fracture of the left femoral neck as described. No dislocation. No other acute skeletal abnormality. Electronically  Signed   By: Amie Portland M.D.   On: 10/25/2018 17:44   Dg Tibia/fibula Left  Result Date: 10/25/2018 CLINICAL DATA:  Fall 1-2 days ago.  Left leg pain. EXAM: LEFT TIBIA AND FIBULA - 2 VIEW COMPARISON:  None. FINDINGS: No fracture or bone lesion. Advanced degenerative changes noted of the knee. Knee joint normally aligned. Ankle joint is normally aligned. Skeletal structures are diffusely demineralized. Soft tissues are unremarkable. IMPRESSION: No fracture or dislocation. Electronically Signed   By: Amie Portland M.D.   On: 10/25/2018 17:46   Dg Tibia/fibula Right  Result Date: 10/25/2018 CLINICAL DATA:  Fall 1 or 2 days ago.  Right leg pain. EXAM: RIGHT TIBIA AND FIBULA - 2 VIEW COMPARISON:  None. FINDINGS: No fracture.  No bone lesion.  Bones are diffusely demineralized. There are advanced degenerative changes of the knee. Knee joint is normally aligned. Ankle joint is normally aligned. Soft tissues are unremarkable. IMPRESSION: No fracture or dislocation Electronically Signed   By: Amie Portland M.D.   On: 10/25/2018 17:45   Ct Head Wo Contrast  Result Date: 10/25/2018 CLINICAL DATA:  Per EMS pt fell on Wednesday EMS was dispatched and pt refused transport. Upon waking this AM pt experiencing pain in her left hip and leg. Pt has been able to walk since fall until today EXAM: CT HEAD WITHOUT CONTRAST CT CERVICAL SPINE WITHOUT CONTRAST TECHNIQUE: Multidetector CT imaging of the head and cervical spine was performed following the standard protocol without intravenous contrast. Multiplanar CT image reconstructions of the cervical spine were also generated. COMPARISON:  Head CT, 03/16/2015.  Brain MRI, 03/17/2015. FINDINGS: CT HEAD FINDINGS Brain: No evidence of acute infarction, hemorrhage, hydrocephalus, extra-axial collection or mass lesion/mass effect. Mild patchy areas of subcortical and periventricular white matter hypoattenuation consistent with chronic microvascular ischemic change. Vascular: No  hyperdense vessel or unexpected calcification. Skull: Normal. Negative for fracture or focal lesion. Sinuses/Orbits: Globes and orbits are unremarkable. Sinuses and mastoid air cells are clear. Other: None. CT CERVICAL SPINE FINDINGS Alignment: Normal. Skull base and vertebrae: No acute fracture. No primary bone lesion or focal pathologic process. Soft tissues and spinal canal: No prevertebral fluid or swelling. No visible canal hematoma. Heterogeneous appearance of the thyroid suggesting bilateral poorly defined nodules. Measure lies esophagus is dilated. No neck masses or enlarged lymph nodes. Carotid vascular calcifications. Disc levels: Mild loss of disc height at C2-C3 and C3-C4. Moderate to marked loss of disc height at C4-C5 through C7-T1 with mild endplate sclerosis and endplate osteophytes. No convincing disc herniation. Upper chest: There is interstitial thickening with mild interval ground-glass opacities at the lung apices, right greater than left. This may be chronic. Consider congestive heart failure if there are consistent clinical findings. Infection is not excluded. Other: None. IMPRESSION: HEAD CT 1. No acute intracranial abnormalities. 2. No skull  fracture. CERVICAL CT 1. No fracture or acute finding. 2. Degenerative changes as detailed. 3. Interstitial thickening and intervening areas of ground-glass opacity at the lung apices, right greater than left. This is consistent with interstitial airspace edema from congestive heart failure in the proper clinical setting. Infection or inflammation could have this appearance. Findings could be chronic. Electronically Signed   By: Lajean Manes M.D.   On: 10/25/2018 18:37   Ct Cervical Spine Wo Contrast  Result Date: 10/25/2018 CLINICAL DATA:  Per EMS pt fell on Wednesday EMS was dispatched and pt refused transport. Upon waking this AM pt experiencing pain in her left hip and leg. Pt has been able to walk since fall until today EXAM: CT HEAD WITHOUT  CONTRAST CT CERVICAL SPINE WITHOUT CONTRAST TECHNIQUE: Multidetector CT imaging of the head and cervical spine was performed following the standard protocol without intravenous contrast. Multiplanar CT image reconstructions of the cervical spine were also generated. COMPARISON:  Head CT, 03/16/2015.  Brain MRI, 03/17/2015. FINDINGS: CT HEAD FINDINGS Brain: No evidence of acute infarction, hemorrhage, hydrocephalus, extra-axial collection or mass lesion/mass effect. Mild patchy areas of subcortical and periventricular white matter hypoattenuation consistent with chronic microvascular ischemic change. Vascular: No hyperdense vessel or unexpected calcification. Skull: Normal. Negative for fracture or focal lesion. Sinuses/Orbits: Globes and orbits are unremarkable. Sinuses and mastoid air cells are clear. Other: None. CT CERVICAL SPINE FINDINGS Alignment: Normal. Skull base and vertebrae: No acute fracture. No primary bone lesion or focal pathologic process. Soft tissues and spinal canal: No prevertebral fluid or swelling. No visible canal hematoma. Heterogeneous appearance of the thyroid suggesting bilateral poorly defined nodules. Measure lies esophagus is dilated. No neck masses or enlarged lymph nodes. Carotid vascular calcifications. Disc levels: Mild loss of disc height at C2-C3 and C3-C4. Moderate to marked loss of disc height at C4-C5 through C7-T1 with mild endplate sclerosis and endplate osteophytes. No convincing disc herniation. Upper chest: There is interstitial thickening with mild interval ground-glass opacities at the lung apices, right greater than left. This may be chronic. Consider congestive heart failure if there are consistent clinical findings. Infection is not excluded. Other: None. IMPRESSION: HEAD CT 1. No acute intracranial abnormalities. 2. No skull fracture. CERVICAL CT 1. No fracture or acute finding. 2. Degenerative changes as detailed. 3. Interstitial thickening and intervening areas of  ground-glass opacity at the lung apices, right greater than left. This is consistent with interstitial airspace edema from congestive heart failure in the proper clinical setting. Infection or inflammation could have this appearance. Findings could be chronic. Electronically Signed   By: Lajean Manes M.D.   On: 10/25/2018 18:37   Ct Hip Left Wo Contrast  Result Date: 10/25/2018 CLINICAL DATA:  Left hip fracture. EXAM: CT OF THE LEFT HIP WITHOUT CONTRAST TECHNIQUE: Multidetector CT imaging of the left hip was performed according to the standard protocol. Multiplanar CT image reconstructions were also generated. COMPARISON:  Radiographs from 10/25/2018 FINDINGS: Bones/Joint/Cartilage Subcapital left femoral neck fracture noted with mild lateral impaction and mild lateral cortical discontinuity. The adjacent portion of the pelvis appears normal. Little if any hip joint effusion. Ligaments Suboptimally assessed by CT. Muscles and Tendons Atrophic gluteus minimus muscle, not uncommon in this age group. Soft tissues Sigmoid colon diverticulosis. Left common femoral artery atherosclerotic calcification. IMPRESSION: 1. Subcapital left femoral neck fracture noted with mild lateral impaction. 2. Sigmoid colon diverticulosis. 3. Left common femoral artery atherosclerotic calcification. Electronically Signed   By: Van Clines M.D.   On: 10/25/2018  21:00   Dg C-arm 1-60 Min  Result Date: 10/26/2018 CLINICAL DATA:  Repair of left hip fracture. FLUOROSCOPY TIME:  1 minutes 42 seconds. Images: 7 EXAM: OPERATIVE LEFT HIP (WITH PELVIS IF PERFORMED) 7 VIEWS TECHNIQUE: Fluoroscopic spot image(s) were submitted for interpretation post-operatively. COMPARISON:  None. FINDINGS: Three screws were placed across the left hip fracture throughout the study. IMPRESSION: Repair of left hip fracture with 3 screws. Electronically Signed   By: Gerome Sam III M.D   On: 10/26/2018 17:45   Dg Hip Operative Unilat W Or W/o Pelvis  Left  Result Date: 10/26/2018 CLINICAL DATA:  Repair of left hip fracture. FLUOROSCOPY TIME:  1 minutes 42 seconds. Images: 7 EXAM: OPERATIVE LEFT HIP (WITH PELVIS IF PERFORMED) 7 VIEWS TECHNIQUE: Fluoroscopic spot image(s) were submitted for interpretation post-operatively. COMPARISON:  None. FINDINGS: Three screws were placed across the left hip fracture throughout the study. IMPRESSION: Repair of left hip fracture with 3 screws. Electronically Signed   By: Gerome Sam III M.D   On: 10/26/2018 17:45   Dg Hip Unilat W Or W/o Pelvis 2-3 Views Left  Result Date: 10/26/2018 CLINICAL DATA:  Status post hip fracture repair. EXAM: DG HIP (WITH OR WITHOUT PELVIS) 2-3V LEFT COMPARISON:  None. FINDINGS: Three surgical screws have been utilized to repair the patient's left hip fracture. No other abnormalities. Hardware is in good position. IMPRESSION: Repair of left hip fracture. Electronically Signed   By: Gerome Sam III M.D   On: 10/26/2018 17:45   Dg Femur Min 2 Views Left  Result Date: 10/25/2018 CLINICAL DATA:  Fall leg pain.  1-2 days ago.  Left EXAM: LEFT FEMUR 2 VIEWS COMPARISON:  None. FINDINGS: There is a subcapital fracture of the left femoral neck. No fracture comminution or significant displacement. There is valgus angulation. No other fractures. No bone lesions. Skeletal structures are demineralized. Hip and knee joints are normally aligned. There are advanced degenerative changes of the left knee joint. Small knee joint effusion. IMPRESSION: 1. Nondisplaced, non comminuted subcapital fracture of the left femoral neck with valgus angulation. 2. No other fractures.  No dislocation Electronically Signed   By: Amie Portland M.D.   On: 10/25/2018 17:49   Dg Femur Min 2 Views Right  Result Date: 10/25/2018 CLINICAL DATA:  Larey Seat 1-2 days ago.  Right leg pain. EXAM: RIGHT FEMUR 2 VIEWS COMPARISON:  None. FINDINGS: No fracture. No bone lesion. The skeletal structures are diffusely demineralized.  Right hip joint normally spaced and aligned. Knee joint normally aligned with advanced degenerative changes. Soft tissues are unremarkable. IMPRESSION: No fracture or dislocation. Electronically Signed   By: Amie Portland M.D.   On: 10/25/2018 17:47     Subjective: No acute issues or events overnight, tolerating PT well, tolerating p.o. well  Discharge Exam: Vitals:   10/30/18 0437 10/30/18 0729  BP: 110/65 132/75  Pulse: 61 68  Resp: 15 16  Temp: 98 F (36.7 C) 97.6 F (36.4 C)  SpO2: 94% 98%   Vitals:   10/29/18 1542 10/29/18 1918 10/30/18 0437 10/30/18 0729  BP: 94/74 112/72 110/65 132/75  Pulse: 67 62 61 68  Resp: Temp: 98.3 F (36.8 C) 98 F (36.7 C) 98 F (36.7 C) 97.6 F (36.4 C)  TempSrc: Oral Oral Oral Oral  SpO2: 94% 94% 94% 98%  Weight:      Height:        General:  Pleasantly resting in bed, No acute distress. HEENT:  Normocephalic atraumatic.  Sclerae nonicteric, noninjected.  Extraocular movements intact bilaterally. Neck:  Without mass or deformity.  Trachea is midline. Lungs:  Clear to auscultate bilaterally without rhonchi, wheeze, or rales. Heart:  Regular rate and rhythm.  Without murmurs, rubs, or gallops. Abdomen:  Soft, nontender, nondistended.  Without guarding or rebound. Extremities: Without cyanosis, clubbing, edema, or obvious deformity. Vascular:  Dorsalis pedis and posterior tibial pulses palpable bilaterally. Skin:  Warm and dry, no erythema, no ulcerations.  The results of significant diagnostics from this hospitalization (including imaging, microbiology, ancillary and laboratory) are listed below for reference.    Microbiology: Recent Results (from the past 240 hour(s))  SARS Coronavirus 2 (CEPHEID - Performed in [facility] hospital lab), Hosp Order     Status: None   Collection Time: 10/25/18  3:59 PM   Specimen: Nasopharyngeal Swab  Result Value Ref Range Status   SARS Coronavirus 2 NEGATIVE NEGATIVE Final    Comment:  (NOTE) If result is NEGATIVE SARS-CoV-2 target nucleic acids are NOT DETECTED. The SARS-CoV-2 RNA is generally detectable in upper and lower  respiratory specimens during the acute phase of infection. The lowest  concentration of SARS-CoV-2 viral copies this assay can detect is 250  copies / mL. A negative result does not preclude SARS-CoV-2 infection  and should not be used as the sole basis for treatment or other  patient management decisions.  A negative result may occur with  improper specimen collection / handling, submission of specimen other  than nasopharyngeal swab, presence of viral mutation(s) within the  areas targeted by this assay, and inadequate number of viral copies  (<250 copies / mL). A negative result must be combined with clinical  observations, patient history, and epidemiological information. If result is POSITIVE SARS-CoV-2 target nucleic acids are DETECTED. The SARS-CoV-2 RNA is generally detectable in upper and lower  respiratory specimens dur ing the acute phase of infection.  Positive  results are indicative of active infection with SARS-CoV-2.  Clinical  correlation with patient history and other diagnostic information is  necessary to determine patient infection status.  Positive results do  not rule out bacterial infection or co-infection with other viruses. If result is PRESUMPTIVE POSTIVE SARS-CoV-2 nucleic acids MAY BE PRESENT.   A presumptive positive result was obtained on the submitted specimen  and confirmed on repeat testing.  While 2019 novel coronavirus  (SARS-CoV-2) nucleic acids may be present in the submitted sample  additional confirmatory testing may be necessary for epidemiological  and / or clinical management purposes  to differentiate between  SARS-CoV-2 and other Sarbecovirus currently known to infect humans.  If clinically indicated additional testing with an alternate test  methodology [id number]) is advised. The SARS-CoV-2 RNA is  generally  detectable in upper and lower respiratory sp ecimens during the acute  phase of infection. The expected result is Negative. Fact Sheet for Patients:  BoilerBrush.com.cy Fact Sheet for Healthcare Providers: https://pope.com/ This test is not yet approved or cleared by the [place] FDA and has been authorized for detection and/or diagnosis of SARS-CoV-2 by FDA under an Emergency Use Authorization (EUA).  This EUA will remain in effect (meaning this test can be used) for the duration of the COVID-19 declaration under Section 564(b)(1) of the Act, 21 U.S.C. section 360bbb-3(b)(1), unless the authorization is terminated or revoked sooner. Performed at [facility], [address] [place], [place] [address]   Blood culture (routine x 2)     Status: None (Preliminary result)  Collection Time: 10/25/18  5:37 PM   Specimen: BLOOD  Result Value Ref Range Status   Specimen Description BLOOD BLOOD RIGHT HAND  Final   Special Requests   Final    BOTTLES DRAWN AEROBIC AND ANAEROBIC Blood Culture adequate volume   Culture   Final    NO GROWTH 4 DAYS Performed at [facility], [address] [place], [place] [address]    Report Status PENDING  Incomplete  Blood culture (routine x 2)     Status: None (Preliminary result)   Collection Time: 10/25/18  5:37 PM   Specimen: BLOOD  Result Value Ref Range Status   Specimen Description BLOOD RIGHT ANTECUBITAL  Final   Special Requests   Final    BOTTLES DRAWN AEROBIC AND ANAEROBIC Blood Culture adequate volume   Culture   Final    NO GROWTH 4 DAYS Performed at [facility], [address] [place], [place] [address]    Report Status PENDING  Incomplete  Urine culture     Status: Abnormal   Collection Time: 10/25/18  7:23 PM   Specimen: Urine, Random  Result Value Ref Range Status   Specimen Description URINE, RANDOM  Final   Special Requests   Final     NONE Performed at [facility], [address] [place], [place] [address]    Culture >=100,000 COLONIES/mL ESCHERICHIA COLI (A)  Final   Report Status 10/30/2018 FINAL  Final   Organism ID, Bacteria ESCHERICHIA COLI (A)  Final      Susceptibility   Escherichia coli - MIC*    AMPICILLIN <=2 SENSITIVE Sensitive     CEFAZOLIN <=4 SENSITIVE Sensitive     CEFTRIAXONE <=1 SENSITIVE Sensitive     CIPROFLOXACIN <=0.25 SENSITIVE Sensitive     GENTAMICIN <=1 SENSITIVE Sensitive     IMIPENEM <=0.25 SENSITIVE Sensitive     NITROFURANTOIN <=16 SENSITIVE Sensitive     TRIMETH/SULFA <=20 SENSITIVE Sensitive     AMPICILLIN/SULBACTAM <=2 SENSITIVE Sensitive     PIP/TAZO <=4 SENSITIVE Sensitive     Extended ESBL NEGATIVE Sensitive     * >=100,000 COLONIES/mL ESCHERICHIA COLI  Surgical PCR screen     Status: Abnormal   Collection Time: 10/25/18 11:45 PM   Specimen: Nasal Mucosa; Nasal Swab  Result Value Ref Range Status   MRSA, PCR NEGATIVE NEGATIVE Final   Staphylococcus aureus POSITIVE (A) NEGATIVE Final    Comment: (NOTE) The Xpert SA Assay (FDA approved for NASAL specimens in patients 76 years of age and older), is one component of a comprehensive surveillance program. It is not intended to diagnose infection nor to guide or monitor treatment. Performed at [facility], [address] [place], [place] [id number]    Labs: BNP (last 3 results) Recent Labs    10/25/18 2221  BNP 471.2*   Basic Metabolic Panel: Recent Labs  Lab 10/25/18 1558 10/26/18 0357  NA 137 137  K 4.5 4.1  CL 102 104  CO2 26 23  GLUCOSE 112* 93  BUN 25* 22  CREATININE 1.16* 1.16*  CALCIUM 9.3 8.8*   Liver Function Tests: Recent Labs  Lab 10/25/18 1558  AST 77*  ALT 73*  ALKPHOS 131*  BILITOT 1.5*  PROT 7.6  ALBUMIN 3.5   No results for input(s): LIPASE, AMYLASE in the last 168 hours. No results for input(s): AMMONIA in the last 168 hours. CBC: Recent Labs  Lab 10/25/18 1558  10/26/18 0357 10/27/18 0539  WBC 10.1 8.7 10.7*  NEUTROABS 8.1*  --   --   HGB 11.0* 10.1* 10.2*  HCT 36.4 33.3* 33.0*  MCV 90.8 90.0 89.7  PLT 165 144* 166   Cardiac Enzymes: Recent Labs  Lab 10/25/18 1558  CKTOTAL 21*   BNP: Invalid input(s): POCBNP CBG: No results for input(s): GLUCAP in the last 168 hours. D-Dimer No results for input(s): DDIMER in the last 72 hours. Hgb A1c No results for input(s): HGBA1C in the last 72 hours. Lipid Profile No results for input(s): CHOL, HDL, LDLCALC, TRIG, CHOLHDL, LDLDIRECT in the last 72 hours. Thyroid function studies No results for input(s): TSH, T4TOTAL, T3FREE, THYROIDAB in the last 72 hours.  Invalid input(s): FREET3 Anemia work up No results for input(s): VITAMINB12, FOLATE, FERRITIN, TIBC, IRON, RETICCTPCT in the last 72 hours. Urinalysis    Component Value Date/Time   COLORURINE AMBER (A) 10/25/2018 1923   APPEARANCEUR HAZY (A) 10/25/2018 1923   LABSPEC 1.019 10/25/2018 1923   PHURINE 5.0 10/25/2018 1923   GLUCOSEU NEGATIVE 10/25/2018 1923   HGBUR SMALL (A) 10/25/2018 1923   BILIRUBINUR NEGATIVE 10/25/2018 1923   KETONESUR NEGATIVE 10/25/2018 1923   PROTEINUR 30 (A) 10/25/2018 1923   NITRITE NEGATIVE 10/25/2018 1923   LEUKOCYTESUR TRACE (A) 10/25/2018 1923   Sepsis Labs Invalid input(s): PROCALCITONIN,  WBC,  LACTICIDVEN Microbiology Recent Results (from the past 240 hour(s))  SARS Coronavirus 2 (CEPHEID - Performed in [facility] hospital lab), Hosp Order     Status: None   Collection Time: 10/25/18  3:59 PM   Specimen: Nasopharyngeal Swab  Result Value Ref Range Status   SARS Coronavirus 2 NEGATIVE NEGATIVE Final    Comment: (NOTE) If result is NEGATIVE SARS-CoV-2 target nucleic acids are NOT DETECTED. The SARS-CoV-2 RNA is generally detectable in upper and lower  respiratory specimens during the acute phase of infection. The lowest  concentration of SARS-CoV-2 viral copies this assay can detect is 250   copies / mL. A negative result does not preclude SARS-CoV-2 infection  and should not be used as the sole basis for treatment or other  patient management decisions.  A negative result may occur with  improper specimen collection / handling, submission of specimen other  than nasopharyngeal swab, presence of viral mutation(s) within the  areas targeted by this assay, and inadequate number of viral copies  (<250 copies / mL). A negative result must be combined with clinical  observations, patient history, and epidemiological information. If result is POSITIVE SARS-CoV-2 target nucleic acids are DETECTED. The SARS-CoV-2 RNA is generally detectable in upper and lower  respiratory specimens dur ing the acute phase of infection.  Positive  results are indicative of active infection with SARS-CoV-2.  Clinical  correlation with patient history and other diagnostic information is  necessary to determine patient infection status.  Positive results do  not rule out bacterial infection or co-infection with other viruses. If result is PRESUMPTIVE POSTIVE SARS-CoV-2 nucleic acids MAY BE PRESENT.   A presumptive positive result was obtained on the submitted specimen  and confirmed on repeat testing.  While 2019 novel coronavirus  (SARS-CoV-2) nucleic acids may be present in the submitted sample  additional confirmatory testing may be necessary for epidemiological  and / or clinical management purposes  to differentiate between  SARS-CoV-2 and other Sarbecovirus currently known to infect humans.  If clinically indicated additional testing with an alternate test  methodology [phone]) is advised. The SARS-CoV-2 RNA is generally  detectable in upper and lower  respiratory sp ecimens during the acute  phase of infection. The expected result is Negative. Fact Sheet for Patients:  BoilerBrush.com.cy Fact Sheet for Healthcare  Providers: https://pope.com/ This test is not yet approved or cleared by the [place] FDA and has been authorized for detection and/or diagnosis of SARS-CoV-2 by FDA under an Emergency Use Authorization (EUA).  This EUA will remain in effect (meaning this test can be used) for the duration of the COVID-19 declaration under Section 564(b)(1) of the Act, 21 U.S.C. section 360bbb-3(b)(1), unless the authorization is terminated or revoked sooner. Performed at [facility], [address], [place], [place] [address]   Blood culture (routine x 2)     Status: None (Preliminary result)   Collection Time: 10/25/18  5:37 PM   Specimen: BLOOD  Result Value Ref Range Status   Specimen Description BLOOD BLOOD RIGHT HAND  Final   Special Requests   Final    BOTTLES DRAWN AEROBIC AND ANAEROBIC Blood Culture adequate volume   Culture   Final    NO GROWTH 4 DAYS Performed at [facility], [address] [place], [place] [address]    Report Status PENDING  Incomplete  Blood culture (routine x 2)     Status: None (Preliminary result)   Collection Time: 10/25/18  5:37 PM   Specimen: BLOOD  Result Value Ref Range Status   Specimen Description BLOOD RIGHT ANTECUBITAL  Final   Special Requests   Final    BOTTLES DRAWN AEROBIC AND ANAEROBIC Blood Culture adequate volume   Culture   Final    NO GROWTH 4 DAYS Performed at [facility], [address], [place], [place] [address]    Report Status PENDING  Incomplete  Urine culture     Status: Abnormal   Collection Time: 10/25/18  7:23 PM   Specimen: Urine, Random  Result Value Ref Range Status   Specimen Description URINE, RANDOM  Final   Special Requests   Final    NONE Performed at [facility], [address] [place], [place] [address]    Culture >=100,000 COLONIES/mL ESCHERICHIA COLI (A)  Final   Report Status 10/30/2018 FINAL  Final   Organism ID, Bacteria ESCHERICHIA COLI (A)  Final       Susceptibility   Escherichia coli - MIC*    AMPICILLIN <=2 SENSITIVE Sensitive     CEFAZOLIN <=4 SENSITIVE Sensitive     CEFTRIAXONE <=1 SENSITIVE Sensitive     CIPROFLOXACIN <=0.25 SENSITIVE Sensitive     GENTAMICIN <=1 SENSITIVE Sensitive     IMIPENEM <=0.25 SENSITIVE Sensitive     NITROFURANTOIN <=16 SENSITIVE Sensitive     TRIMETH/SULFA <=20 SENSITIVE Sensitive     AMPICILLIN/SULBACTAM <=2 SENSITIVE Sensitive     PIP/TAZO <=4 SENSITIVE Sensitive     Extended ESBL NEGATIVE Sensitive     * >=100,000 COLONIES/mL ESCHERICHIA COLI  Surgical PCR screen     Status: Abnormal   Collection Time: 10/25/18 11:45 PM   Specimen: Nasal Mucosa; Nasal Swab  Result Value Ref Range Status   MRSA, PCR NEGATIVE NEGATIVE Final   Staphylococcus aureus POSITIVE (A) NEGATIVE Final    Comment: (NOTE) The Xpert SA Assay (FDA approved for NASAL specimens in patients 71 years of age and older), is one component of a comprehensive surveillance program. It is not intended to diagnose infection nor to guide or monitor treatment. Performed at [facility], [address] [place], [place] [address]  Time coordinating discharge: Over 30 minutes  SIGNED:  Azucena FallenWilliam C Wessley Emert, DO [facility] 10/30/2018, 11:31 AM Pager   If 7PM-7AM, please contact night-coverage [email or web address] Password [email or web address]

## 2020-02-20 IMAGING — CT CT OF THE LEFT HIP WITHOUT CONTRAST
2 of 3 series · 17 of 46 positions shown, 19 images · non-contrast
Comparison: Radiographs from 10/25/2018

CLINICAL DATA: Left hip fracture.

EXAM:
CT OF THE LEFT HIP WITHOUT CONTRAST
TECHNIQUE: Multidetector CT imaging of the left hip was performed according to
the standard protocol. Multiplanar CT image reconstructions were
also generated.

[Series 3: pelvis 2.0 st · axial · 0.45mm/px · z∈[-534,-360]mm · 14 of 101 slices shown, 16 images]
[im 7/101  soft-tissue]
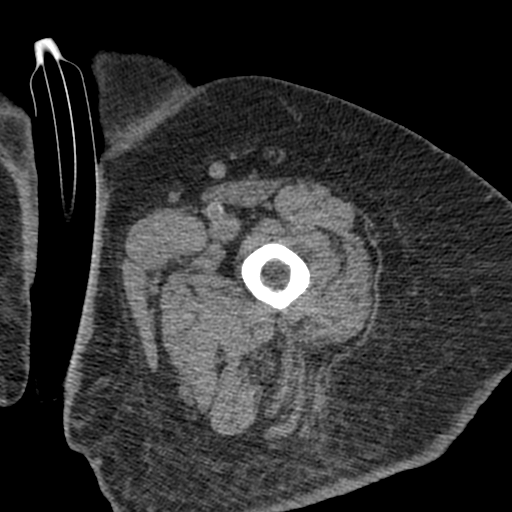
[im 7/101  bone]
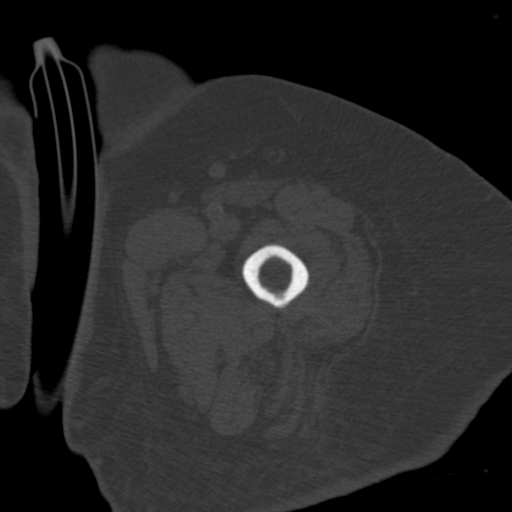
[im 13/101  soft-tissue]
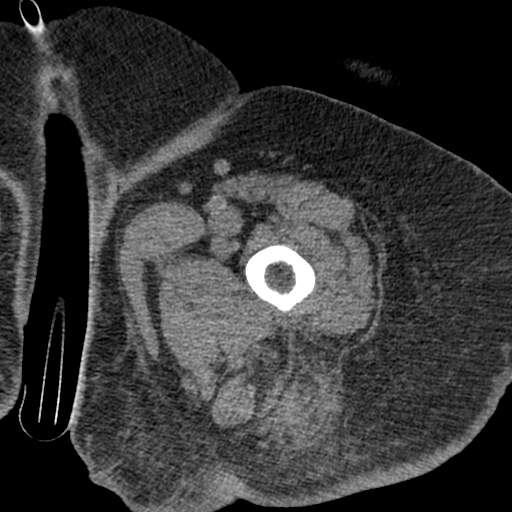
[im 20/101  soft-tissue]
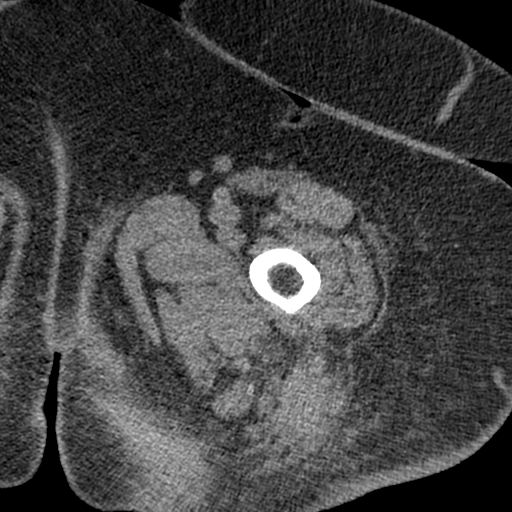
[im 26/101  soft-tissue]
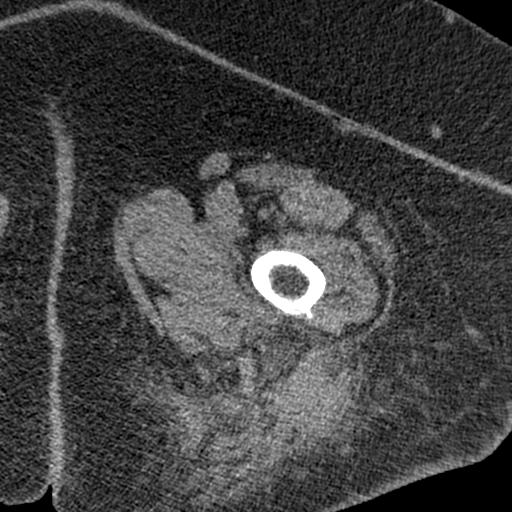
[im 33/101  soft-tissue]
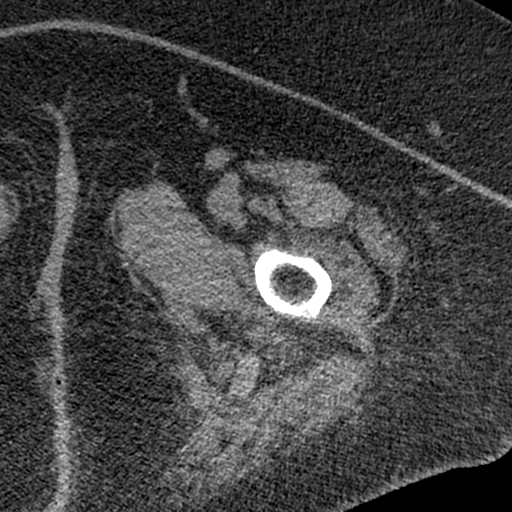
[im 39/101  soft-tissue]
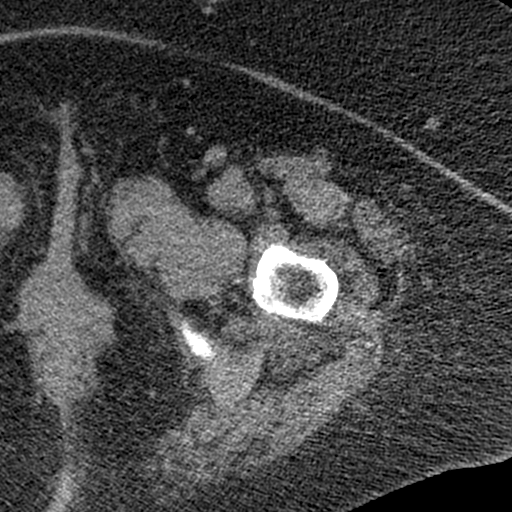
[im 46/101  soft-tissue]
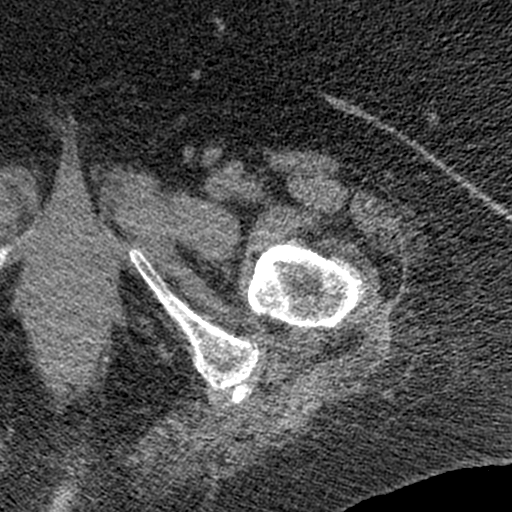
[im 55/101  soft-tissue]
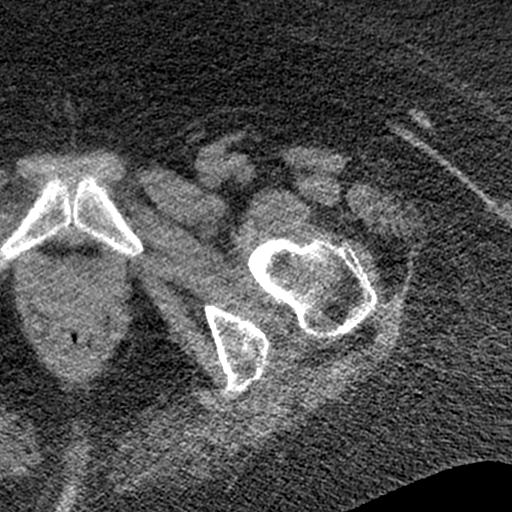
[im 62/101  soft-tissue]
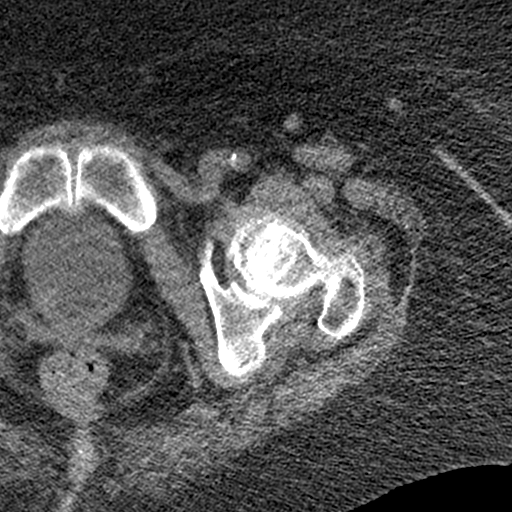
[im 62/101  bone]
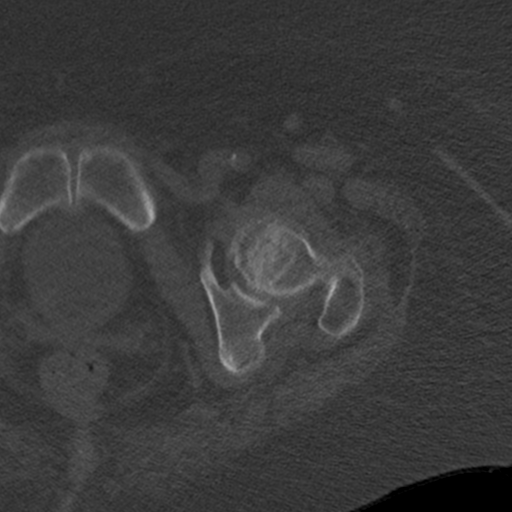
[im 68/101  soft-tissue]
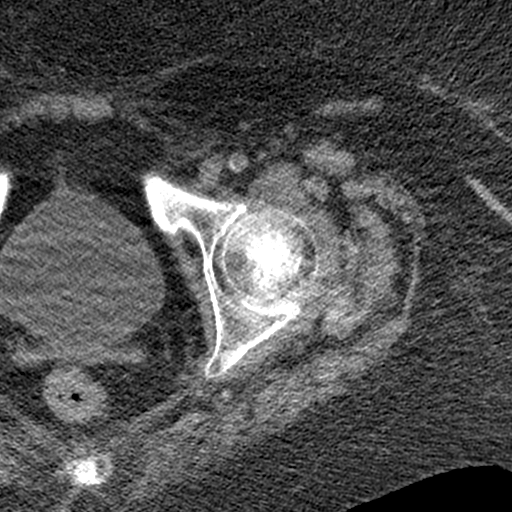
[im 75/101  soft-tissue]
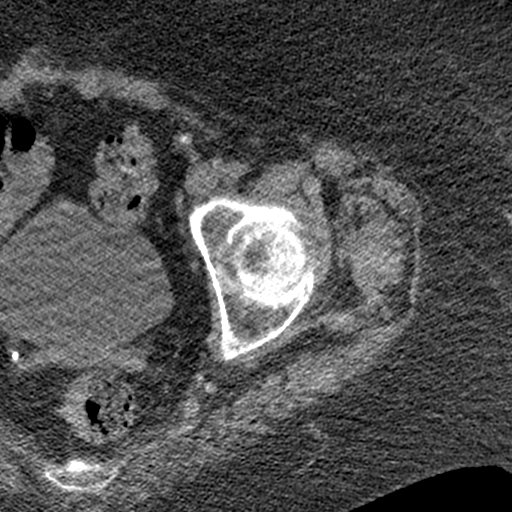
[im 81/101  soft-tissue]
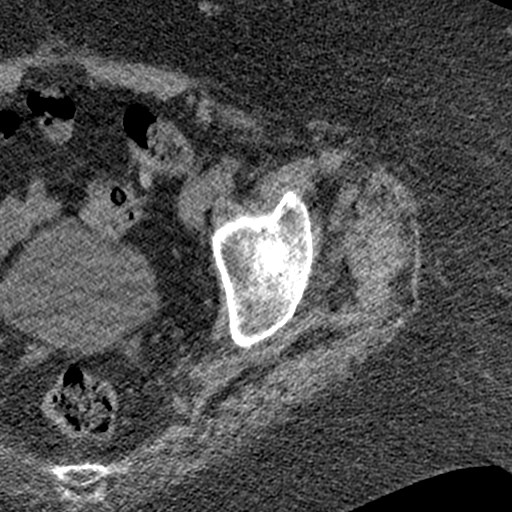
[im 88/101  soft-tissue]
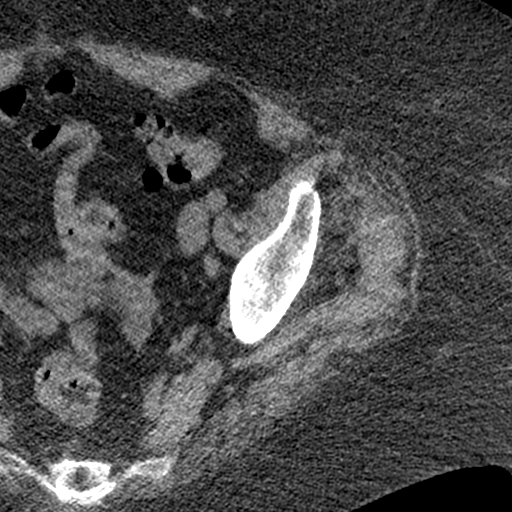
[im 94/101  soft-tissue]
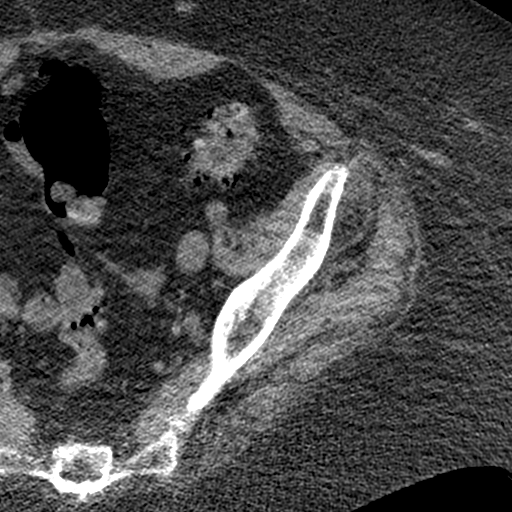

[Series 8: coronal st · coronal · 0.41mm/px · 3 of 114 slices shown]
[im 38/114  soft-tissue]
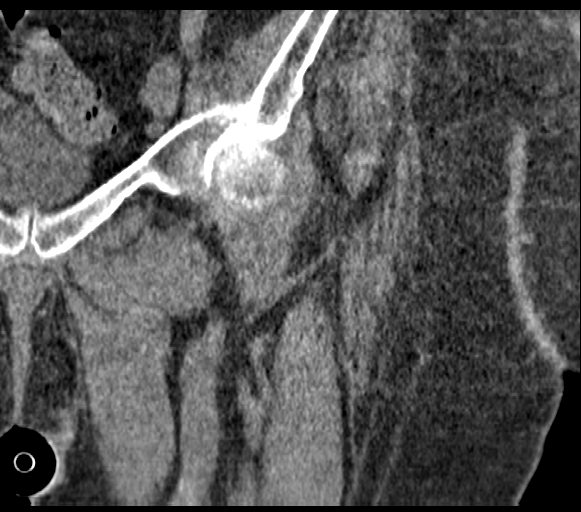
[im 51/114  soft-tissue]
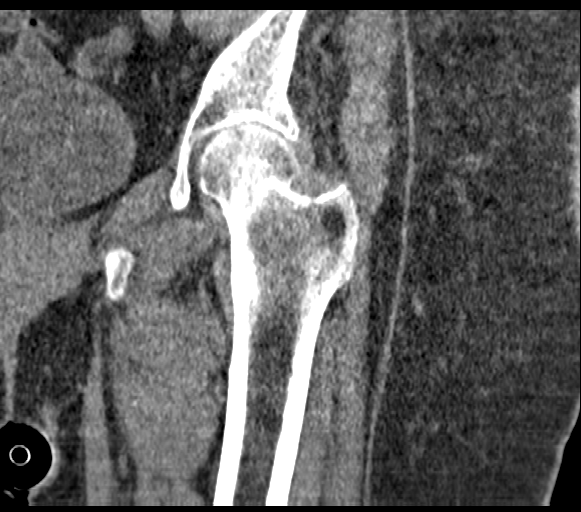
[im 63/114  soft-tissue]
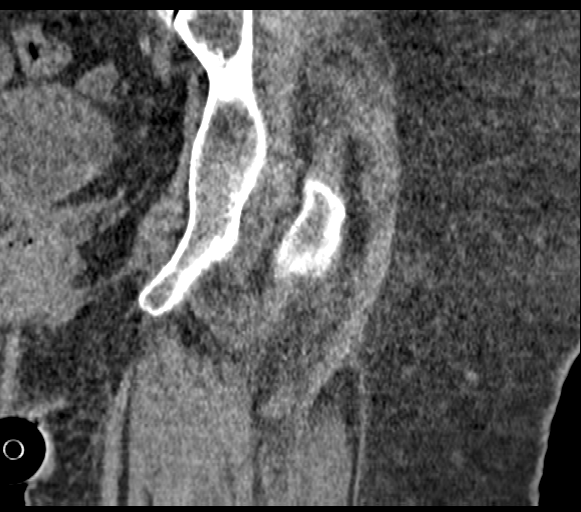

[17 of 46 positions shown; findings below may reference images not displayed]

FINDINGS: Bones/Joint/Cartilage

Subcapital left femoral neck fracture noted with mild lateral
impaction and mild lateral cortical discontinuity. The adjacent
portion of the pelvis appears normal. Little if any hip joint
effusion.

Ligaments

Suboptimally assessed by CT.

Muscles and Tendons

Atrophic gluteus minimus muscle, not uncommon in this age group.

Soft tissues

Sigmoid colon diverticulosis. Left common femoral artery
atherosclerotic calcification.
IMPRESSION: 1. Subcapital left femoral neck fracture noted with mild lateral
impaction.
2. Sigmoid colon diverticulosis.
3. Left common femoral artery atherosclerotic calcification.

## 2020-02-20 IMAGING — CR RIGHT FEMUR 2 VIEWS
4 series · 4 of 4 positions shown · non-contrast
Comparison: None.

CLINICAL DATA: Fell 1-2 days ago.  Right leg pain.

EXAM:
RIGHT FEMUR 2 VIEWS

[femur ap (1 of 2)]
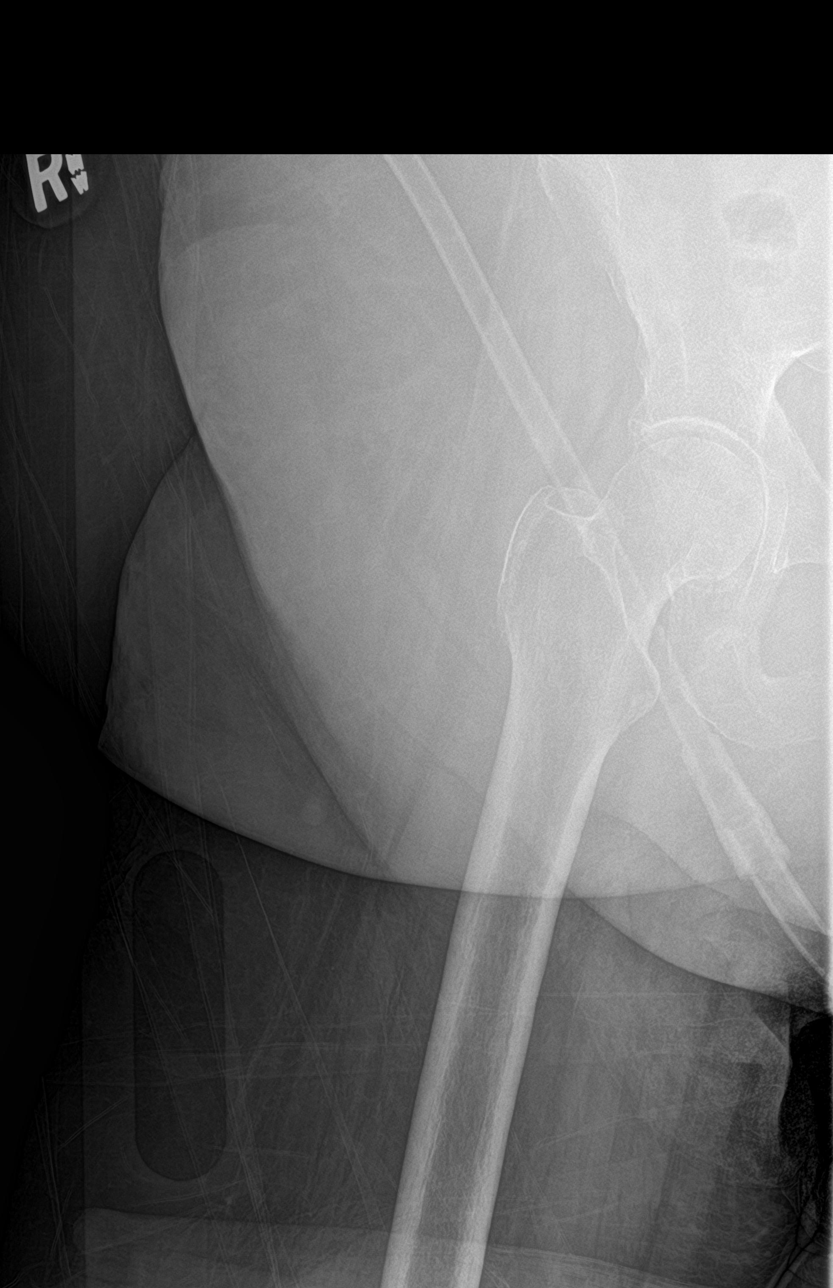

[femur ap (2 of 2)]
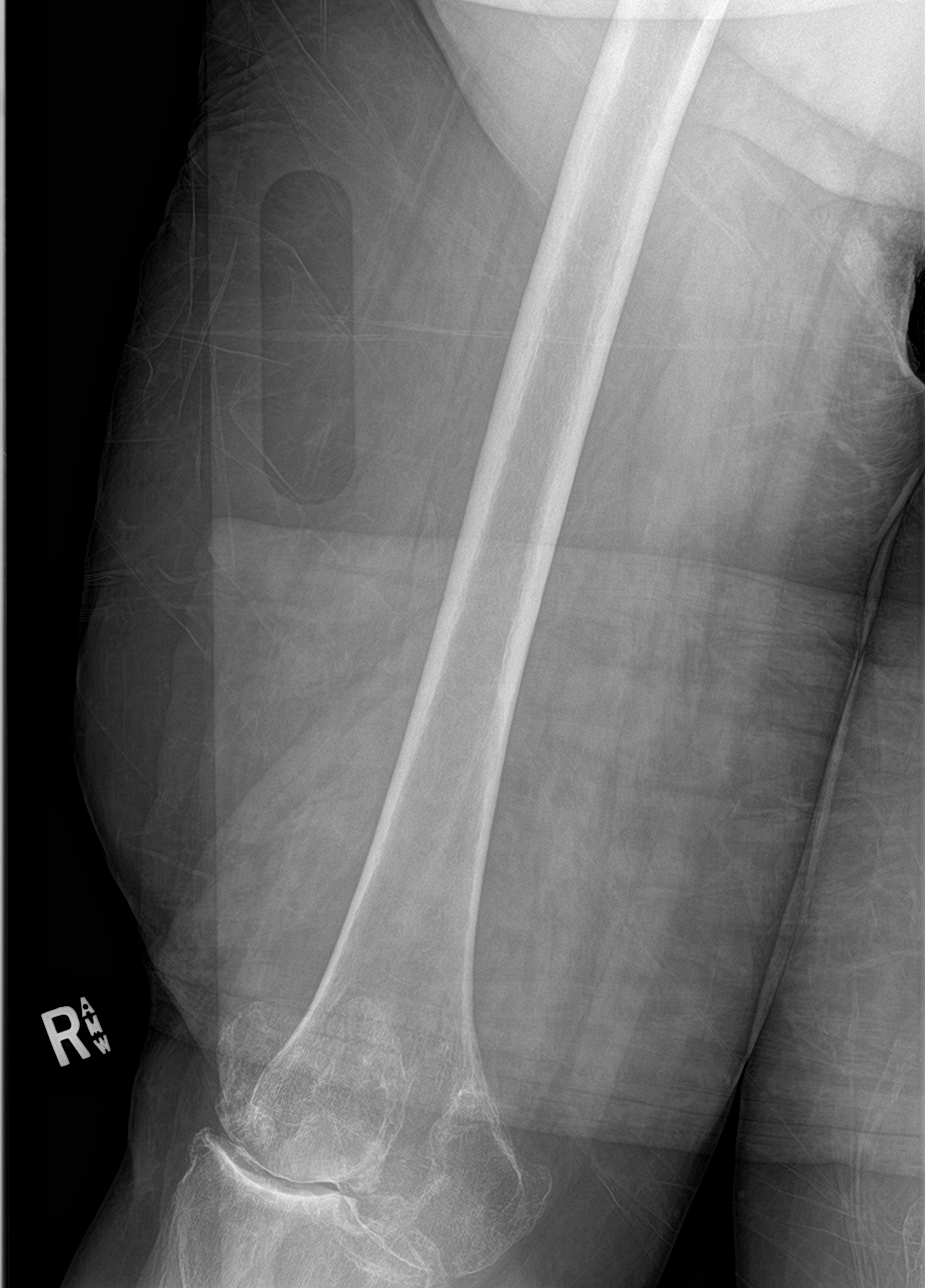

[femur lat (1 of 2)]
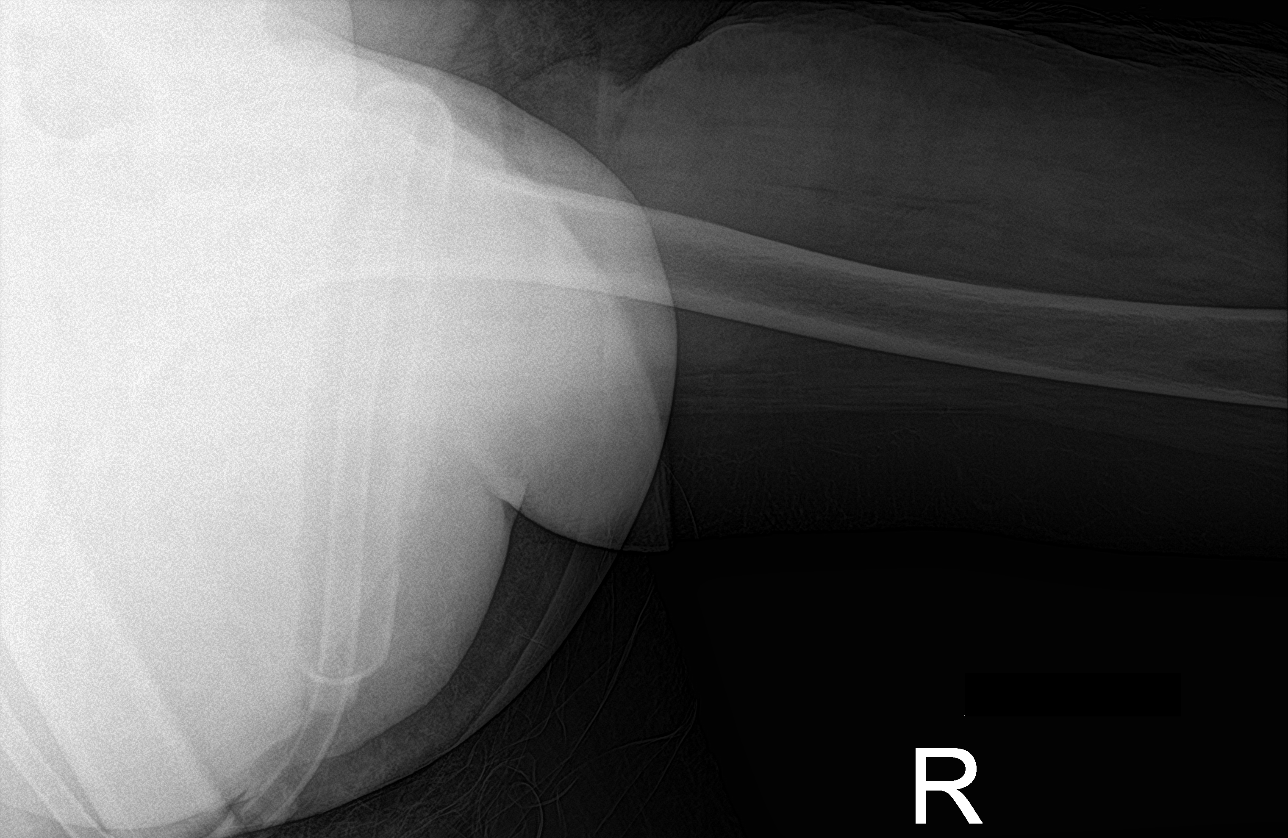

[femur lat (2 of 2)]
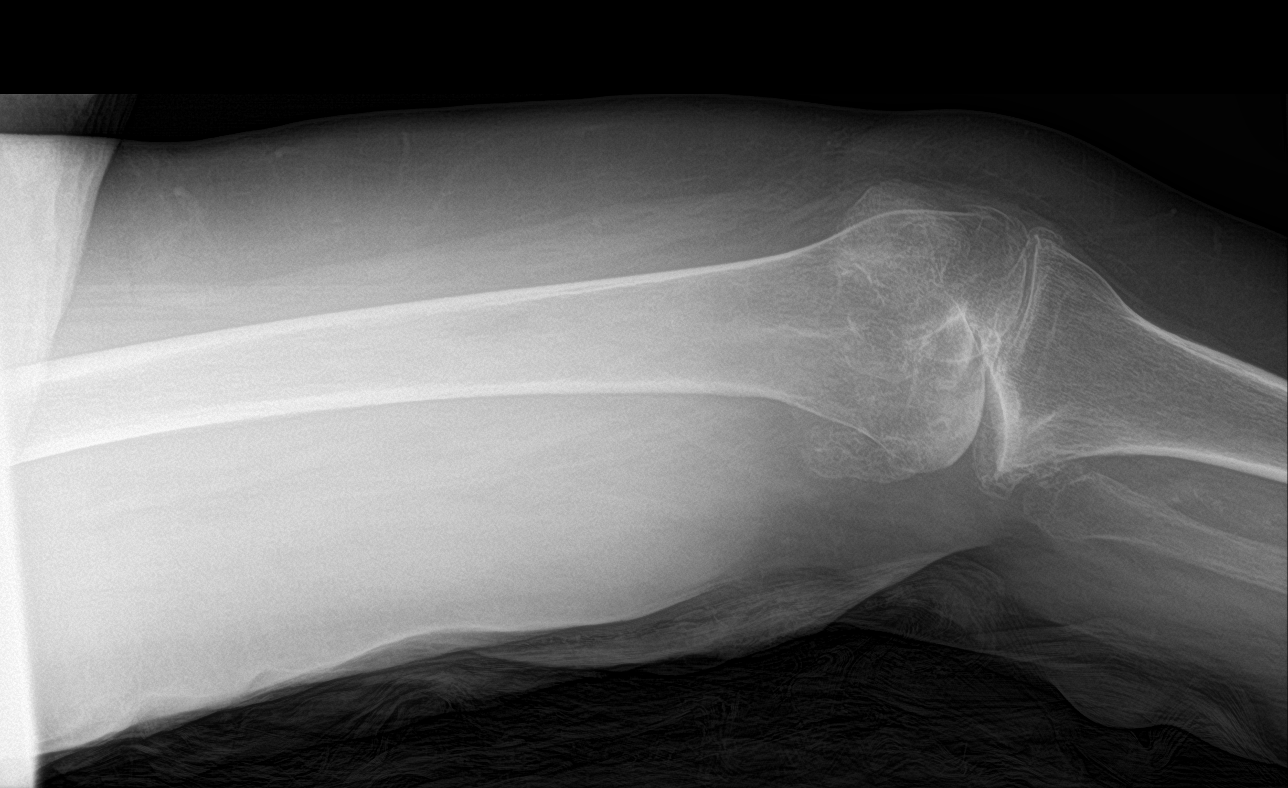

[4 of 4 positions shown; findings below may reference images not displayed]

FINDINGS: No fracture. No bone lesion. The skeletal structures are diffusely
demineralized. Right hip joint normally spaced and aligned. Knee
joint normally aligned with advanced degenerative changes.

Soft tissues are unremarkable.
IMPRESSION: No fracture or dislocation.
